# Patient Record
Sex: Female | Born: 1970 | Hispanic: No | Marital: Single | Smoking: Never smoker
Health system: Southern US, Community
[De-identification: ages and names within clinical notes are randomized; demographics above are authoritative.]

## PROBLEM LIST (undated history)

## (undated) DIAGNOSIS — E785 Hyperlipidemia, unspecified: Secondary | ICD-10-CM

## (undated) DIAGNOSIS — T7840XA Allergy, unspecified, initial encounter: Secondary | ICD-10-CM

## (undated) HISTORY — DX: Allergy, unspecified, initial encounter: T78.40XA

## (undated) HISTORY — PX: TUBAL LIGATION: SHX77

---
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
## 1997-08-28 DIAGNOSIS — R519 Headache, unspecified: Secondary | ICD-10-CM

## 1997-08-28 HISTORY — DX: Headache, unspecified: R51.9

## 1999-08-29 HISTORY — PX: TUBAL LIGATION: SHX77

## 2000-09-20 ENCOUNTER — Other Ambulatory Visit: Admission: RE | Admit: 2000-09-20 | Discharge: 2000-09-20 | Payer: Self-pay | Admitting: Obstetrics

## 2000-10-24 ENCOUNTER — Ambulatory Visit (HOSPITAL_COMMUNITY): Admission: RE | Admit: 2000-10-24 | Discharge: 2000-10-24 | Payer: Self-pay | Admitting: Cardiology

## 2000-10-24 ENCOUNTER — Encounter: Payer: Self-pay | Admitting: Cardiology

## 2000-12-04 ENCOUNTER — Encounter: Payer: Self-pay | Admitting: Emergency Medicine

## 2000-12-04 ENCOUNTER — Emergency Department (HOSPITAL_COMMUNITY): Admission: EM | Admit: 2000-12-04 | Discharge: 2000-12-04 | Payer: Self-pay | Admitting: Emergency Medicine

## 2001-01-29 ENCOUNTER — Emergency Department (HOSPITAL_COMMUNITY): Admission: EM | Admit: 2001-01-29 | Discharge: 2001-01-29 | Payer: Self-pay | Admitting: Emergency Medicine

## 2001-01-30 ENCOUNTER — Emergency Department (HOSPITAL_COMMUNITY): Admission: EM | Admit: 2001-01-30 | Discharge: 2001-01-30 | Payer: Self-pay | Admitting: *Deleted

## 2001-02-01 ENCOUNTER — Emergency Department (HOSPITAL_COMMUNITY): Admission: EM | Admit: 2001-02-01 | Discharge: 2001-02-01 | Payer: Self-pay | Admitting: Emergency Medicine

## 2001-04-30 ENCOUNTER — Encounter: Payer: Self-pay | Admitting: Cardiology

## 2001-04-30 ENCOUNTER — Encounter: Admission: RE | Admit: 2001-04-30 | Discharge: 2001-04-30 | Payer: Self-pay | Admitting: Cardiology

## 2001-05-05 ENCOUNTER — Emergency Department (HOSPITAL_COMMUNITY): Admission: EM | Admit: 2001-05-05 | Discharge: 2001-05-06 | Payer: Self-pay | Admitting: Emergency Medicine

## 2001-07-04 ENCOUNTER — Emergency Department (HOSPITAL_COMMUNITY): Admission: EM | Admit: 2001-07-04 | Discharge: 2001-07-04 | Payer: Self-pay | Admitting: Emergency Medicine

## 2001-08-22 ENCOUNTER — Encounter: Payer: Self-pay | Admitting: Emergency Medicine

## 2001-08-22 ENCOUNTER — Emergency Department (HOSPITAL_COMMUNITY): Admission: EM | Admit: 2001-08-22 | Discharge: 2001-08-22 | Payer: Self-pay | Admitting: Emergency Medicine

## 2001-10-11 ENCOUNTER — Emergency Department (HOSPITAL_COMMUNITY): Admission: EM | Admit: 2001-10-11 | Discharge: 2001-10-12 | Payer: Self-pay | Admitting: Emergency Medicine

## 2002-02-24 ENCOUNTER — Emergency Department (HOSPITAL_COMMUNITY): Admission: EM | Admit: 2002-02-24 | Discharge: 2002-02-25 | Payer: Self-pay | Admitting: Emergency Medicine

## 2002-02-24 ENCOUNTER — Encounter: Payer: Self-pay | Admitting: Emergency Medicine

## 2002-06-26 ENCOUNTER — Emergency Department (HOSPITAL_COMMUNITY): Admission: EM | Admit: 2002-06-26 | Discharge: 2002-06-26 | Payer: Self-pay | Admitting: Emergency Medicine

## 2002-06-27 ENCOUNTER — Encounter: Payer: Self-pay | Admitting: *Deleted

## 2003-05-27 ENCOUNTER — Emergency Department (HOSPITAL_COMMUNITY): Admission: EM | Admit: 2003-05-27 | Discharge: 2003-05-27 | Payer: Self-pay | Admitting: Emergency Medicine

## 2003-05-28 ENCOUNTER — Encounter: Payer: Self-pay | Admitting: Emergency Medicine

## 2003-06-03 ENCOUNTER — Emergency Department (HOSPITAL_COMMUNITY): Admission: AD | Admit: 2003-06-03 | Discharge: 2003-06-03 | Payer: Self-pay | Admitting: Family Medicine

## 2004-08-15 ENCOUNTER — Emergency Department (HOSPITAL_COMMUNITY): Admission: EM | Admit: 2004-08-15 | Discharge: 2004-08-15 | Payer: Self-pay | Admitting: Emergency Medicine

## 2006-02-05 ENCOUNTER — Emergency Department (HOSPITAL_COMMUNITY): Admission: EM | Admit: 2006-02-05 | Discharge: 2006-02-06 | Payer: Self-pay | Admitting: Emergency Medicine

## 2008-05-30 ENCOUNTER — Emergency Department (HOSPITAL_COMMUNITY): Admission: EM | Admit: 2008-05-30 | Discharge: 2008-05-30 | Payer: Self-pay | Admitting: Emergency Medicine

## 2009-11-29 LAB — LIPID PANEL: Triglycerides: 224 mg/dL — AB (ref 40–160)

## 2010-02-12 ENCOUNTER — Emergency Department (HOSPITAL_COMMUNITY)
Admission: EM | Admit: 2010-02-12 | Discharge: 2010-02-12 | Payer: Self-pay | Source: Home / Self Care | Admitting: Emergency Medicine

## 2010-02-19 ENCOUNTER — Emergency Department (HOSPITAL_COMMUNITY)
Admission: EM | Admit: 2010-02-19 | Discharge: 2010-02-19 | Payer: Self-pay | Source: Home / Self Care | Admitting: Emergency Medicine

## 2010-04-15 ENCOUNTER — Emergency Department (HOSPITAL_COMMUNITY): Admission: EM | Admit: 2010-04-15 | Discharge: 2010-04-15 | Payer: Self-pay | Admitting: Family Medicine

## 2010-04-18 ENCOUNTER — Ambulatory Visit: Payer: Self-pay | Admitting: Nurse Practitioner

## 2010-04-18 DIAGNOSIS — L503 Dermatographic urticaria: Secondary | ICD-10-CM

## 2010-04-18 LAB — CONVERTED CEMR LAB: Blood Glucose, Fingerstick: 83

## 2010-08-05 ENCOUNTER — Emergency Department (HOSPITAL_COMMUNITY)
Admission: EM | Admit: 2010-08-05 | Discharge: 2010-08-05 | Payer: Self-pay | Source: Home / Self Care | Admitting: Emergency Medicine

## 2010-08-09 ENCOUNTER — Ambulatory Visit: Payer: Self-pay | Admitting: Family Medicine

## 2010-08-09 DIAGNOSIS — M25519 Pain in unspecified shoulder: Secondary | ICD-10-CM

## 2010-08-09 DIAGNOSIS — M542 Cervicalgia: Secondary | ICD-10-CM | POA: Insufficient documentation

## 2010-08-17 ENCOUNTER — Encounter: Payer: Self-pay | Admitting: Family Medicine

## 2010-08-30 ENCOUNTER — Ambulatory Visit
Admission: RE | Admit: 2010-08-30 | Discharge: 2010-08-30 | Payer: Self-pay | Source: Home / Self Care | Attending: Family Medicine | Admitting: Family Medicine

## 2010-08-30 ENCOUNTER — Encounter: Payer: Self-pay | Admitting: Family Medicine

## 2010-09-18 ENCOUNTER — Encounter: Payer: Self-pay | Admitting: Obstetrics

## 2010-09-29 NOTE — Letter (Signed)
Summary: Out of Work  [facility] at [facility]  [address] [place], [place] [address]   Phone: [phone]  Fax: [phone]    August 09, 2010   Employee:  Sherri Walton    To Whom It May Concern:   For Medical reasons, please place on limited light duty capacity, maximum lifting 5 pounds until cleared by MD.   Start:   08/09/2010  End:   08/30/2009, f/u with MD then  If you need additional information, please feel free to contact our office.         Sincerely,    Hannah Beat MD

## 2010-09-29 NOTE — Assessment & Plan Note (Signed)
Summary: F/U NECK,MC   Vital Signs:  Patient profile:   40 year old female Pulse rate:   89 / minute BP sitting:   120 / 82  (left arm) CC: f/u neck pain improved, now w/ mid back pain    History of Present Illness: Follow up after change in meds and exercises to help neck stiffness.  Now pain in lower thoracic and upper lumbar area with associated stiffness/tightness in shoulders.  Neck stiffness has improved.  Numbness and tingling in fingers BL and R leg has continued, but predated neck stiffness. Multi-year.  Believes  Tizanidine are helping neck.  Has restricted lifting to <5 pounds.  Continues exercises with improvement.    Neck rom dramatically better  Preventive Screening-Counseling & Management  Alcohol-Tobacco     Smoking Status: never  Allergies: 1)  ! Toradol 2)  ! * Minocycline  Past History:  Past medical, surgical, family and social histories (including risk factors) reviewed, and no changes noted (except as noted below).  Past Medical History: Reviewed history from 08/09/2010 and no changes required. noncontributory  Past Surgical History: Reviewed history from 04/18/2010 and no changes required. c-section  tubal ligation  Family History: Reviewed history from 04/18/2010 and no changes required. father - gastric cancer (deceased) 11 mother - diabetes brother (deceased) heart disease at 41  Social History: Reviewed history from 04/18/2010 and no changes required. 3 children tobacco - none ETOH - none Drug - none  Review of Systems       REVIEW OF SYSTEMS  GEN: No systemic complaints, no fevers, chills, sweats, or other acute illnesses MSK: Detailed in the HPI GI: tolerating PO intake without difficulty Neuro: as above Otherwise the pertinent positives of the ROS are noted above.    Physical Exam  General:  Well-developed,well-nourished,in no acute distress; alert,appropriate and cooperative throughout examination Head:  Normocephalic and  atraumatic without obvious abnormalities. No apparent alopecia or balding. Ears:  no external deformities.   Nose:  no external deformity.   Lungs:  normal respiratory effort.   Msk:  Full ROM in neck.    Negative Tinel and Phalen's signs. No tenderness to palpation in spine or paraspinal muscles.  Slight scoliosis in lumbar spine.    Full ROM at shoulder str 5/5 c5-t1 intact motor and sensory  some scapular anterior rotation and prominence  Insignificant leg length discrepancy. Neurologic:  Normal strength, sensation in BL upper and lower extremities. Psych:  Cognition and judgment appear intact. Alert and cooperative with normal attention span and concentration. No apparent delusions, illusions, hallucinations   Impression & Recommendations:  Problem # 1:  SHOULDER PAIN (ICD-719.41) Assessment Improved  Continued bilateral shoulder stiffness with improvement in neck pain.  Will start Gabapentin.  suspect neuropathic origin  Her updated medication list for this problem includes:    Tizanidine Hcl 4 Mg Tabs (Tizanidine hcl) .Marland Kitchen... 1 by mouth at bedtime  Her updated medication list for this problem includes:    Tizanidine Hcl 4 Mg Tabs (Tizanidine hcl) .Marland Kitchen... 1 by mouth at bedtime  Problem # 2:  NECK PAIN (ICD-723.1)  Her updated medication list for this problem includes:    Tizanidine Hcl 4 Mg Tabs (Tizanidine hcl) .Marland Kitchen... 1 by mouth at bedtime  Complete Medication List: 1)  Loratadine 10 Mg Tabs (Loratadine) .... One tablet by mouth daily 2)  Tizanidine Hcl 4 Mg Tabs (Tizanidine hcl) .Marland Kitchen.. 1 by mouth at bedtime 3)  Gabapentin 300 Mg Caps (Gabapentin) .Marland Kitchen.. 1 by mouth three times  a day  Patient Instructions: 1)  Keep up the neck and shoulder exercises. 2)  Gabapentin titration: 3)  1 tablet at night for 3 days, then 1 tablet twice a day for 3 days, then start 1 tablet three times a day  4)  recheck 6 weeks Prescriptions: GABAPENTIN 300 MG CAPS (GABAPENTIN) 1 by mouth three  times a day  #90 x 2   Entered and Authorized by:   Hannah Beat MD   Signed by:   Hannah Beat MD on 08/30/2010   Method used:   Print then Give to Patient   RxID:   [id number]    Orders Added: 1)  Est. Patient Level III [87564]

## 2010-09-29 NOTE — Assessment & Plan Note (Signed)
Summary: NEW - Establish Care   Vital Signs:  Patient profile:   40 year old female LMP:     02/2010 Height:      67 inches Weight:      201.2 pounds BMI:     31.63 Temp:     97.7 degrees F oral Pulse rate:   73 / minute Pulse rhythm:   regular Resp:     20 per minute BP sitting:   109 / 70  (left arm) Cuff size:   regular  Vitals Entered By: Levon Hedger (April 18, 2010 12:17 PM)  Nutrition Counseling: Patient's BMI is greater than 25 and therefore counseled on weight management options. CC: follow-up visit urgent care skin irritation with itching and discomfort, alot of burning x 2 months Is Patient Diabetic? No Pain Assessment Patient in pain? no      CBG Result 83 CBG Device ID [id number]  Does patient need assistance? Functional Status Self care Ambulation Normal LMP (date): 02/2010     Enter LMP: 02/2010   CC:  follow-up visit urgent care skin irritation with itching and discomfort and alot of burning x 2 months.  History of Present Illness:  Pt into the office to establish care.   Previously going to Dr. Gaynell Face (who is her PCP) however her medicaid card indicated [facility] .  That office also does not accept medicaid.  Pt only went for GYN concerns and was seen usually once per year.  Skin - started about 2 months ago. Whelps come spontaneously and then resolve in about 5-10 minutes. She does not scratch.  initially started on chest now whelps can occur on any part of the body.  Seems to be increasing in intensity. She also says that she knows the areas are present because she has tingling. Recent out of the country travel to [place].  She stayed 8 days. Pt has gone to Er at least twice for this problem.  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  Habits & Providers  Alcohol-Tobacco-Diet     Alcohol drinks/day: 0     Tobacco Status:  never  Exercise-Depression-Behavior     Drug Use: never  Allergies (verified): 1)  ! Toradol 2)  ! * Minocycline  Past History:  Past Surgical History: c-section  tubal ligation  Family History: father - gastric cancer (deceased) 81 mother - diabetes brother (deceased) heart disease at 20  Social History: 3 children tobacco - none ETOH - none Drug - noneSmoking Status:  never Drug Use:  never  Review of Systems CV:  Denies chest pain or discomfort. Resp:  Denies cough. GI:  Denies abdominal pain, nausea, and vomiting. Derm:  +whelps.  Physical Exam  General:  alert.   Head:  normocephalic.   Eyes:  glasses Msk:  normal ROM.   Neurologic:  alert & oriented X3.   Skin:  spontaneous linear whelp of urticaria appeared in exact locations of strokes Psych:  Oriented X3.     Impression & Recommendations:  Problem # 1:  DERMATOGRAPHIA (ICD-708.3) reviewed with pt the dx will start on anti-histamine pt questions referral to allergist but does not think this necessary  Complete Medication List: 1)  Loratadine 10 Mg Tabs (Loratadine) .... One tablet by mouth daily  Patient Instructions: 1)  Read the handout 2)  Take anti-histamine by mouth daily - this will not cure  but will ease the symptoms some. 3)  Follow up as needed Prescriptions: LORATADINE 10 MG TABS (LORATADINE) One tablet by mouth daily  #30 x 3   Entered and Authorized by:   Lehman Prom FNP   Signed by:   Lehman Prom FNP on 04/18/2010   Method used:   Print then Give to Patient   RxID:   [id number]

## 2010-09-29 NOTE — Assessment & Plan Note (Signed)
Summary: NP,NECK/SHOULDER PAIN,MC   Vital Signs:  Patient profile:   40 year old female Height:      67 inches Weight:      210 pounds BMI:     33.01 Pulse rate:   74 / minute BP sitting:   115 / 77  Vitals Entered By: Rochele Pages RN (August 09, 2010 1:41 PM) CC: rt side neck and shoulder pain, rt arm and leg numbness, finger tingling x1 week   History of Present Illness: 40 year old female:  very pleasant patient who presents with a 1-2 week history right-sided neck, shoulder blade, and arm pain. She also complains of some shoulder pain.  Currently has pain of motion of the neck and she has had some intermittent tingling of the right arm. She currently has none. Does not provoke will.  She saw her primary care provider and was placed on Naprelan. She also subsequently went to the ER and was given some thiamine hydrocodone. All of her symptoms persist, though the numbness is improved.  She has been lifting more heavy boxes at work, greater than 50 pounds, and she states this is exacerbated the posterior shoulder blade and neck musculature.  REVIEW OF SYSTEMS  GEN: No systemic complaints, no fevers, chills, sweats, or other acute illnesses MSK: Detailed in the HPI GI: tolerating PO intake without difficulty Neuro: as above Otherwise the pertinent positives of the ROS are noted above.    Preventive Screening-Counseling & Management  Alcohol-Tobacco     Smoking Status: never  Allergies: 1)  ! Toradol 2)  ! * Minocycline  Past History:  Past medical, surgical, family and social histories (including risk factors) reviewed, and no changes noted (except as noted below).  Past Medical History: noncontributory  Past Surgical History: Reviewed history from 04/18/2010 and no changes required. c-section  tubal ligation  Family History: Reviewed history from 04/18/2010 and no changes required. father - gastric cancer (deceased) 7 mother - diabetes brother (deceased)  heart disease at 40  Social History: Reviewed history from 04/18/2010 and no changes required. 3 children tobacco - none ETOH - none Drug - none  Physical Exam  General:  GEN: Well-developed,well-nourished,in no acute distress; alert,appropriate and cooperative throughout examination HEENT: Normocephalic and atraumatic without obvious abnormalities. No apparent alopecia or balding. Ears, externally no deformities PULM: Breathing comfortably in no respiratory distress EXT: No clubbing, cyanosis, or edema PSYCH: Normally interactive. Cooperative during the interview. Pleasant. Friendly and conversant. Not anxious or depressed appearing. Normal, full affect.  Msk:  cervical spine: Significant loss of motion, approximately 60-70% of terminal flexion, and extension, lateral bending, and rotation in both directions. Follow tender along the spinous processes.  Negative Spurling's examination. C5-T1 is intact.  Grip strength is mildly decreased on the right compared to left.  Sensation intact.  Shoulder: R Inspection: No muscle wasting or winging Ecchymosis/edema: neg  AC joint, scapula, clavicle: NT Cervical spine: NT, full ROM Spurling's: neg Abduction: full, 5/5 Flexion: full, 5/5 IR, full, lift-off: 5/5 ER at neutral: full, 5/5 AC crossover and compression: neg Neer: neg Hawkins: neg Drop Test: neg Empty Can: neg Supraspinatus insertion: NT Bicipital groove: NT Speed's: neg Yergason's: neg Sulcus sign: neg Scapular dyskinesis: none C5-T1 intact Sensation intact Grip 5/5    Impression & Recommendations:  Problem # 1:  NECK PAIN (ICD-723.1) cystoscopy with acute muscle strain. No evidence today of any significant neurological findings red flags. We'll treat conservative.  Continue with naproxen, and change to Zanaflex at nighttime due  to sedation for bowing.  Place of additional limited light duty capacity, and follow up in 3 weeks.  Handouts reviewed with the  patient.  Range of motion in massage, and heat as well as home exercise program.  Her updated medication list for this problem includes:    Tizanidine Hcl 4 Mg Tabs (Tizanidine hcl) .Marland Kitchen... 1 by mouth at bedtime  Problem # 2:  SHOULDER PAIN (ICD-719.41)  Her updated medication list for this problem includes:    Tizanidine Hcl 4 Mg Tabs (Tizanidine hcl) .Marland Kitchen... 1 by mouth at bedtime  Complete Medication List: 1)  Loratadine 10 Mg Tabs (Loratadine) .... One tablet by mouth daily 2)  Tizanidine Hcl 4 Mg Tabs (Tizanidine hcl) .Marland Kitchen.. 1 by mouth at bedtime Prescriptions: TIZANIDINE HCL 4 MG TABS (TIZANIDINE HCL) 1 by mouth at bedtime  #30 x 1   Entered and Authorized by:   Hannah Beat MD   Signed by:   Hannah Beat MD on 08/09/2010   Method used:   Print then Give to Patient   RxID:   [id number]    Orders Added: 1)  New Patient Level III [40102]

## 2010-09-29 NOTE — Letter (Signed)
Summary: Work Excuse  [facility]  [address] [place], [place] [address]   Phone: [phone]  Fax: [phone]    Today's Date: April 18, 2010  Name of Patient: Sherri Walton  The above named patient had a medical visit today   Please take this into consideration when reviewing the time away from work    Special Instructions:  [  ] None  [ X ] To be off the remainder of today, returning to the normal work  schedule tomorrow.  [  ] To be off until the next scheduled appointment on ______________________.  [  ] Other ________________________________________________________________ ________________________________________________________________________   Sincerely yours,   Lehman Prom FNP [facility]

## 2010-09-29 NOTE — Letter (Signed)
Summary: Out of Work  [facility]  [address]   [place], [place] [address]   Phone: [phone]  Fax: [phone]    August 30, 2010   Employee:  PRESLEI BLAKLEY    To Whom It May Concern:   For Medical reasons, ok to return to work, maximum lifting 20 pounds:  Start:   08/30/2010  End:   recheck 6 weeks  If you need additional information, please feel free to contact our office.         Sincerely,    Hannah Beat MD

## 2010-09-29 NOTE — Letter (Signed)
Summary: PT INFORMATION SHEET  PT INFORMATION SHEET   Imported By: Arta Bruce 04/19/2010 10:39:20  _____________________________________________________________________  External Attachment:    Type:   Image     Comment:   External Document

## 2010-09-29 NOTE — Letter (Signed)
Summary: ROI  ROI   Imported By: Marily Memos 08/17/2010 09:50:56  _____________________________________________________________________  External Attachment:    Type:   Image     Comment:   External Document

## 2010-11-10 LAB — WET PREP, GENITAL: Clue Cells Wet Prep HPF POC: NONE SEEN

## 2010-11-10 LAB — GC/CHLAMYDIA PROBE AMP, GENITAL
Chlamydia, DNA Probe: NEGATIVE
GC Probe Amp, Genital: NEGATIVE

## 2010-11-10 LAB — POCT URINALYSIS DIPSTICK
Glucose, UA: NEGATIVE mg/dL
Nitrite: POSITIVE — AB
Protein, ur: 30 mg/dL — AB
Urobilinogen, UA: 0.2 mg/dL (ref 0.0–1.0)
pH: 7 (ref 5.0–8.0)

## 2010-11-10 LAB — URINE CULTURE

## 2010-12-17 ENCOUNTER — Emergency Department (HOSPITAL_COMMUNITY)
Admission: EM | Admit: 2010-12-17 | Discharge: 2010-12-17 | Disposition: A | Discharge status: Home / Self Care | Payer: Medicaid Other | Attending: Emergency Medicine | Admitting: Emergency Medicine

## 2010-12-17 DIAGNOSIS — R10815 Periumbilic abdominal tenderness: Secondary | ICD-10-CM | POA: Insufficient documentation

## 2010-12-17 DIAGNOSIS — Z9889 Other specified postprocedural states: Secondary | ICD-10-CM | POA: Insufficient documentation

## 2010-12-17 DIAGNOSIS — R197 Diarrhea, unspecified: Secondary | ICD-10-CM | POA: Insufficient documentation

## 2010-12-17 LAB — POCT I-STAT, CHEM 8
BUN: 11 mg/dL (ref 6–23)
Calcium, Ion: 1.15 mmol/L (ref 1.12–1.32)
Chloride: 107 meq/L (ref 96–112)
Creatinine, Ser: 0.9 mg/dL (ref 0.4–1.2)
Glucose, Bld: 103 mg/dL — ABNORMAL HIGH (ref 70–99)
HCT: 40 % (ref 36.0–46.0)
Hemoglobin: 13.6 g/dL (ref 12.0–15.0)
Potassium: 4.2 meq/L (ref 3.5–5.1)
Sodium: 139 meq/L (ref 135–145)
TCO2: 22 mmol/L (ref 0–100)

## 2010-12-17 LAB — OCCULT BLOOD, POC DEVICE: Fecal Occult Bld: NEGATIVE

## 2011-05-29 LAB — GLUCOSE, CAPILLARY: Glucose-Capillary: 89

## 2011-11-30 ENCOUNTER — Emergency Department (HOSPITAL_COMMUNITY)
Admission: EM | Admit: 2011-11-30 | Discharge: 2011-11-30 | Disposition: A | Discharge status: Home / Self Care | Payer: Self-pay | Attending: Emergency Medicine | Admitting: Emergency Medicine

## 2011-11-30 ENCOUNTER — Encounter (HOSPITAL_COMMUNITY): Payer: Self-pay | Admitting: *Deleted

## 2011-11-30 DIAGNOSIS — N390 Urinary tract infection, site not specified: Secondary | ICD-10-CM | POA: Insufficient documentation

## 2011-11-30 LAB — URINALYSIS, ROUTINE W REFLEX MICROSCOPIC
Ketones, ur: NEGATIVE mg/dL
Nitrite: POSITIVE — AB
Specific Gravity, Urine: 1.021 (ref 1.005–1.030)
Urobilinogen, UA: 0.2 mg/dL (ref 0.0–1.0)
pH: 6 (ref 5.0–8.0)

## 2011-11-30 LAB — URINE MICROSCOPIC-ADD ON

## 2011-11-30 MED ORDER — CIPROFLOXACIN HCL 500 MG PO TABS: 500.0000 mg | ORAL_TABLET | Freq: Two times a day (BID) | ORAL | Status: AC

## 2011-11-30 NOTE — Discharge Instructions (Signed)

## 2011-11-30 NOTE — ED Notes (Signed)
Pt reports painful urination x 2 weeks. Denies frequency.

## 2011-11-30 NOTE — ED Provider Notes (Signed)
History     CSN: [id number]  Arrival date & time 11/30/11  0920   First MD Initiated Contact with Patient 11/30/11 1035      Chief Complaint  Patient presents with  . Urinary Retention    (Consider location/radiation/quality/duration/timing/severity/associated sxs/prior treatment) HPI  Patient with dysuria, frequency and odor to urine for two weeks with symptoms worsening.  No fever, chills, nausea or vomiting.  Periods regular, no birth control, denies sexual activity, some vaginal discharge prior to menses but not now.    History reviewed. No pertinent past medical history.  Past Surgical History  Procedure Date  . Cesarean section   . Tubal ligation     No family history on file.  History  Substance Use Topics  . Smoking status: Never Smoker   . Smokeless tobacco: Not on file  . Alcohol Use: No    OB History    Grav Para Term Preterm Abortions TAB SAB Ect Mult Living                  Review of Systems  All other systems reviewed and are negative.    Allergies  Ketorolac tromethamine and Minocycline  Home Medications   Current Outpatient Rx  Name Route Sig Dispense Refill  . ACETAMINOPHEN 500 MG PO TABS Oral Take 1,000 mg by mouth every 6 (six) hours as needed. pain    . ADULT MULTIVITAMIN W/MINERALS CH Oral Take 1 tablet by mouth daily.      BP 118/73  Pulse 85  Temp(Src) 98 F (36.7 C) (Oral)  Resp 16  SpO2 100%  Physical Exam  Nursing note and vitals reviewed. Constitutional: She appears well-developed and well-nourished.  HENT:  Head: Normocephalic and atraumatic.  Eyes: Conjunctivae and EOM are normal. Pupils are equal, round, and reactive to light.  Neck: Normal range of motion. Neck supple.  Cardiovascular: Normal rate, regular rhythm, normal heart sounds and intact distal pulses.   Pulmonary/Chest: Effort normal and breath sounds normal.  Abdominal: Soft. Bowel sounds are normal.  Musculoskeletal: Normal range of motion.    Neurological: She is alert.  Skin: Skin is warm and dry.  Psychiatric: She has a normal mood and affect. Thought content normal.    ED Course  Procedures (including critical care time)  Labs Reviewed  URINALYSIS, ROUTINE W REFLEX MICROSCOPIC - Abnormal; Notable for the following:    APPearance CLOUDY (*)    Hgb urine dipstick MODERATE (*)    Nitrite POSITIVE (*)    Leukocytes, UA SMALL (*)    All other components within normal limits  URINE MICROSCOPIC-ADD ON - Abnormal; Notable for the following:    Bacteria, UA MANY (*)    All other components within normal limits  POCT PREGNANCY, URINE   No results found.   No diagnosis found.    MDM  Patient with nitrite and le positive ua with uti symptoms.  Plan bactrim ds for uti.       Hilario Quarry, MD 11/30/11 1122

## 2012-08-26 ENCOUNTER — Ambulatory Visit (INDEPENDENT_AMBULATORY_CARE_PROVIDER_SITE_OTHER): Payer: PRIVATE HEALTH INSURANCE | Admitting: Family Medicine

## 2012-08-26 ENCOUNTER — Encounter: Payer: Self-pay | Admitting: Physician Assistant

## 2012-08-26 VITALS — BP 105/73 | HR 78 | Temp 98.3°F | Resp 16 | Ht 68.0 in | Wt 218.2 lb

## 2012-08-26 DIAGNOSIS — E7800 Pure hypercholesterolemia, unspecified: Secondary | ICD-10-CM

## 2012-08-26 DIAGNOSIS — R350 Frequency of micturition: Secondary | ICD-10-CM

## 2012-08-26 DIAGNOSIS — N760 Acute vaginitis: Secondary | ICD-10-CM

## 2012-08-26 DIAGNOSIS — J069 Acute upper respiratory infection, unspecified: Secondary | ICD-10-CM

## 2012-08-26 DIAGNOSIS — Z0000 Encounter for general adult medical examination without abnormal findings: Secondary | ICD-10-CM

## 2012-08-26 DIAGNOSIS — Z789 Other specified health status: Secondary | ICD-10-CM

## 2012-08-26 DIAGNOSIS — Z23 Encounter for immunization: Secondary | ICD-10-CM

## 2012-08-26 DIAGNOSIS — N390 Urinary tract infection, site not specified: Secondary | ICD-10-CM

## 2012-08-26 DIAGNOSIS — B373 Candidiasis of vulva and vagina: Secondary | ICD-10-CM

## 2012-08-26 DIAGNOSIS — N898 Other specified noninflammatory disorders of vagina: Secondary | ICD-10-CM

## 2012-08-26 LAB — POCT CBC
Granulocyte percent: 48.5 %G (ref 37–80)
MID (cbc): 0.4 (ref 0–0.9)
MPV: 7.4 fL (ref 0–99.8)
POC Granulocyte: 1.9 — AB (ref 2–6.9)
POC LYMPH PERCENT: 40 %L (ref 10–50)
POC MID %: 11.5 %M (ref 0–12)
Platelet Count, POC: 305 10*3/uL (ref 142–424)
RBC: 4.46 M/uL (ref 4.04–5.48)
RDW, POC: 12.6 %

## 2012-08-26 LAB — POCT UA - MICROSCOPIC ONLY
Crystals, Ur, HPF, POC: NEGATIVE
Mucus, UA: NEGATIVE

## 2012-08-26 LAB — COMPREHENSIVE METABOLIC PANEL
AST: 19 U/L (ref 0–37)
Albumin: 4.2 g/dL (ref 3.5–5.2)
Alkaline Phosphatase: 72 U/L (ref 39–117)
Glucose, Bld: 86 mg/dL (ref 70–99)
Potassium: 3.9 mEq/L (ref 3.5–5.3)
Sodium: 142 mEq/L (ref 135–145)
Total Bilirubin: 0.8 mg/dL (ref 0.3–1.2)
Total Protein: 6.9 g/dL (ref 6.0–8.3)

## 2012-08-26 LAB — LIPID PANEL
Cholesterol: 238 mg/dL — ABNORMAL HIGH (ref 0–200)
Triglycerides: 166 mg/dL — ABNORMAL HIGH (ref <150)
VLDL: 33 mg/dL (ref 0–40)

## 2012-08-26 LAB — POCT URINALYSIS DIPSTICK
Bilirubin, UA: NEGATIVE
Ketones, UA: NEGATIVE
Protein, UA: NEGATIVE
Spec Grav, UA: 1.015
pH, UA: 7

## 2012-08-26 LAB — POCT WET PREP WITH KOH
KOH Prep POC: POSITIVE
Trichomonas, UA: NEGATIVE

## 2012-08-26 LAB — GLUCOSE, POCT (MANUAL RESULT ENTRY): POC Glucose: 78 mg/dl (ref 70–99)

## 2012-08-26 MED ORDER — GUAIFENESIN ER 1200 MG PO TB12: 1.0000 | ORAL_TABLET | Freq: Two times a day (BID) | ORAL | Status: DC

## 2012-08-26 MED ORDER — FLUCONAZOLE 150 MG PO TABS: 150.0000 mg | ORAL_TABLET | Freq: Once | ORAL | Status: DC

## 2012-08-26 MED ORDER — ALBUTEROL SULFATE HFA 108 (90 BASE) MCG/ACT IN AERS: 2.0000 | INHALATION_SPRAY | Freq: Four times a day (QID) | RESPIRATORY_TRACT | Status: DC | PRN

## 2012-08-26 MED ORDER — HYDROCOD POLST-CHLORPHEN POLST 10-8 MG/5ML PO LQCR: 5.0000 mL | Freq: Two times a day (BID) | ORAL | Status: DC

## 2012-08-26 MED ORDER — METRONIDAZOLE 500 MG PO TABS: 500.0000 mg | ORAL_TABLET | Freq: Two times a day (BID) | ORAL | Status: DC

## 2012-08-26 MED ORDER — CIPROFLOXACIN HCL 500 MG PO TABS: 500.0000 mg | ORAL_TABLET | Freq: Two times a day (BID) | ORAL | Status: DC

## 2012-08-26 NOTE — Progress Notes (Signed)
[address] [place] [address]   Phone [phone]  Subjective:    Patient ID: Sherri Walton, female    DOB: [birth year], 41 y.o.   MRN: [id number]  HPI Pt presents to clinic for several concerns 1- wants a CPE - had a pap smear within the last couple of years, unsure if it was normal.  No new sexual partners but would like STD screening.  Has had some tingling in her hands and fingers of both hands while at work 2-3 times in the last month, she has had no weakness and it only lasts a few minutes.  She has had similar sensation in her R foot and leg.  She has had no injuries.  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  When she stands she stands for long periods of time with his R hip off the the side.   She never has these pains at home or when not working.  She has had h/o elevated cholesterol but has never been treated with medications.  She has never had a mammogram and does not do self breast exams. 2- cold symptoms over the last couple of days.  Daughter was sick with similar symptoms.  She has nasal congestion and a cough which is worse at night. Last pm she was wheezing, though has had no h/o asthma her daughter does have asthma.  She has used no OTC medications.   3- she has had urinary frequency and urgency with some dysuria for about the past week.  Her urine also has a strong odor.  She has some vaginal discharge but does not believe that is what smells.  She has no itching.   Review of Systems  Constitutional: Negative for fever, chills, activity change and appetite change.  HENT: Positive for congestion, rhinorrhea and postnasal drip. Negative for ear pain, sore throat, sneezing, neck pain and sinus pressure.   Respiratory: Positive for cough (non-productive) and wheezing (at night). Negative for shortness of breath.   Cardiovascular: Negative for chest pain, palpitations and leg swelling.  Gastrointestinal: Negative.   Genitourinary: Positive for dysuria, frequency and  vaginal discharge. Negative for hematuria, flank pain, decreased urine volume, vaginal bleeding, genital sores, vaginal pain, menstrual problem (regular menses) and dyspareunia.  Musculoskeletal: Negative.   Skin: Negative.   Neurological: Positive for headaches (no change from her normal). Negative for dizziness, weakness and numbness.  Hematological: Negative.   Psychiatric/Behavioral: Negative.        Objective:   Physical Exam  Vitals reviewed. Constitutional: She is oriented to person, place, and time. She appears well-developed and well-nourished.  HENT:  Head: Normocephalic and atraumatic.  Right Ear: Hearing, tympanic membrane, external ear and ear canal normal.  Left Ear: Hearing, tympanic membrane, external ear and ear canal normal.  Nose: Mucosal edema present.  Mouth/Throat: Uvula is midline, oropharynx is clear and moist and mucous membranes are normal. No oropharyngeal exudate.  Eyes: Conjunctivae normal and EOM are normal. Pupils are equal, round, and reactive to light.  Neck: Neck supple. No thyromegaly present.  Cardiovascular: Normal rate, regular rhythm and normal heart sounds.   No murmur heard. Pulmonary/Chest: Effort normal and breath sounds normal. She has no wheezes.  Abdominal: Soft. Bowel sounds are normal. There is no CVA tenderness.  Genitourinary: Uterus normal. No breast swelling, tenderness, discharge or bleeding. Pelvic exam was performed with patient supine. There is no rash, tenderness, lesion or injury on the right labia. There is no rash, tenderness, lesion or  injury on the left labia. Cervix exhibits no motion tenderness, no discharge and no friability. Right adnexum displays no mass, no tenderness and no fullness. Left adnexum displays no mass, no tenderness and no fullness. No erythema, tenderness or bleeding around the vagina. No foreign body around the vagina. No signs of injury around the vagina. Vaginal discharge (thick white d/c in vaginal canal)  found.  Musculoskeletal:       neg Tinels and neg phalens.  No weakness or atrophy of hands, finger, feet toes.  Lymphadenopathy:    She has no cervical adenopathy.  Neurological: She is alert and oriented to person, place, and time. She has normal strength. No cranial nerve deficit or sensory deficit.  Reflex Scores:      Bicep reflexes are 2+ on the right side and 2+ on the left side.      Brachioradialis reflexes are 2+ on the right side and 2+ on the left side.      Patellar reflexes are 2+ on the right side and 2+ on the left side.      Achilles reflexes are 2+ on the right side and 2+ on the left side. Skin: Skin is warm and dry. She is not diaphoretic.  Psychiatric: She has a normal mood and affect. Her behavior is normal. Judgment and thought content normal.    Results for orders placed in visit on 08/26/12  POCT UA - MICROSCOPIC ONLY      Component Value Range   WBC, Ur, HPF, POC 10-15     RBC, urine, microscopic 1-4     Bacteria, U Microscopic 4++     Mucus, UA neg     Epithelial cells, urine per micros 5-10     Crystals, Ur, HPF, POC neg     Casts, Ur, LPF, POC neg     Yeast, UA neg    POCT URINALYSIS DIPSTICK      Component Value Range   Color, UA yellow     Clarity, UA cloudy     Glucose, UA neg     Bilirubin, UA neg     Ketones, UA neg     Spec Grav, UA 1.015     Blood, UA moderate     pH, UA 7.0     Protein, UA neg     Urobilinogen, UA 1.0     Nitrite, UA postivie     Leukocytes, UA moderate (2+)    POCT CBC      Component Value Range   WBC 3.9 (*) 4.6 - 10.2 K/uL   Lymph, poc 1.6  0.6 - 3.4   POC LYMPH PERCENT 40.0  10 - 50 %L   MID (cbc) 0.4  0 - 0.9   POC MID % 11.5  0 - 12 %M   POC Granulocyte 1.9 (*) 2 - 6.9   Granulocyte percent 48.5  37 - 80 %G   RBC 4.46  4.04 - 5.48 M/uL   Hemoglobin 13.6  12.2 - 16.2 g/dL   HCT, POC 16.1  09.6 - 47.9 %   MCV 98.8 (*) 80 - 97 fL   MCH, POC 30.5  27 - 31.2 pg   MCHC 30.8 (*) 31.8 - 35.4 g/dL   RDW, POC  04.5     Platelet Count, POC 305  142 - 424 K/uL   MPV 7.4  0 - 99.8 fL  GLUCOSE, POCT (MANUAL RESULT ENTRY)      Component Value Range   POC Glucose  78  70 - 99 mg/dl  POCT WET PREP WITH KOH      Component Value Range   Trichomonas, UA Negative     Clue Cells Wet Prep HPF POC 100%     Epithelial Wet Prep HPF POC tntc     Yeast Wet Prep HPF POC positive     Bacteria Wet Prep HPF POC 4++     RBC Wet Prep HPF POC tntc     WBC Wet Prep HPF POC tntc     KOH Prep POC Positive      EKG results: Normal.  Read by Dr. Patsy Lager.      Assessment & Plan:   1. Annual physical exam  EKG 12-Lead, POCT CBC, POCT glucose (manual entry), Lipid panel, RPR, TSH, HIV antibody, Hepatitis C antibody, Hepatitis B surface antigen, Comprehensive metabolic panel, Pap IG, CT/NG w/ reflex HPV when ASC-U, MM Digital Screening, metroNIDAZOLE (FLAGYL) 500 MG tablet  2. Urinary frequency  POCT UA - Microscopic Only, POCT urinalysis dipstick  3. Flu vaccine need  Flu vaccine greater than or equal to 3yo preservative free IM  4. Vaginal discharge  POCT Wet Prep with KOH  5. Urinary tract infection, site not specified  Urine culture, ciprofloxacin (CIPRO) 500 MG tablet  6. BV (bacterial vaginosis)  metroNIDAZOLE (FLAGYL) 500 MG tablet  7. Yeast vaginitis  fluconazole (DIFLUCAN) 150 MG tablet  8. Viral URI with cough  Guaifenesin (MUCINEX MAXIMUM STRENGTH) 1200 MG TB12, chlorpheniramine-HYDROcodone (TUSSIONEX PENNKINETIC ER) 10-8 MG/5ML LQCR, albuterol (PROVENTIL HFA;VENTOLIN HFA) 108 (90 BASE) MCG/ACT inhaler

## 2012-08-27 LAB — HEPATITIS C ANTIBODY: HCV Ab: NEGATIVE

## 2012-08-27 LAB — TSH: TSH: 0.915 u[IU]/mL (ref 0.350–4.500)

## 2012-08-27 LAB — HIV ANTIBODY (ROUTINE TESTING W REFLEX): HIV: NONREACTIVE

## 2012-08-28 LAB — PAP IG, CT-NG, RFX HPV ASCU
Chlamydia Probe Amp: NEGATIVE
GC Probe Amp: NEGATIVE

## 2012-08-29 LAB — URINE CULTURE: Colony Count: >=100000

## 2012-09-02 MED ORDER — PRAVASTATIN SODIUM 40 MG PO TABS: 40.0000 mg | ORAL_TABLET | Freq: Every day | ORAL | Status: DC

## 2012-09-02 NOTE — Addendum Note (Signed)
Addended by: Morrell Riddle on: 09/02/2012 11:20 AM   Modules accepted: Orders

## 2012-09-04 ENCOUNTER — Encounter: Payer: Self-pay | Admitting: *Deleted

## 2012-10-04 ENCOUNTER — Other Ambulatory Visit: Payer: Self-pay | Admitting: Physician Assistant

## 2012-10-04 DIAGNOSIS — E7800 Pure hypercholesterolemia, unspecified: Secondary | ICD-10-CM

## 2012-10-04 MED ORDER — PRAVASTATIN SODIUM 40 MG PO TABS: 40.0000 mg | ORAL_TABLET | Freq: Every day | ORAL | Status: DC

## 2012-10-04 NOTE — Telephone Encounter (Signed)
Please call and tell patient that I sent in a Rx for her cholesterol and I should see her 2 months after starting the medication.

## 2012-10-09 ENCOUNTER — Ambulatory Visit (HOSPITAL_COMMUNITY)
Admission: RE | Admit: 2012-10-09 | Discharge: 2012-10-09 | Disposition: A | Discharge status: Home / Self Care | Payer: Medicaid Other | Source: Ambulatory Visit | Attending: Physician Assistant | Admitting: Physician Assistant

## 2012-10-09 DIAGNOSIS — Z1231 Encounter for screening mammogram for malignant neoplasm of breast: Secondary | ICD-10-CM | POA: Insufficient documentation

## 2012-10-09 DIAGNOSIS — Z0000 Encounter for general adult medical examination without abnormal findings: Secondary | ICD-10-CM

## 2013-01-31 ENCOUNTER — Emergency Department (HOSPITAL_COMMUNITY): Payer: Medicaid Other

## 2013-01-31 ENCOUNTER — Emergency Department (HOSPITAL_COMMUNITY)
Admission: EM | Admit: 2013-01-31 | Discharge: 2013-01-31 | Disposition: A | Discharge status: Home / Self Care | Payer: Medicaid Other | Attending: Emergency Medicine | Admitting: Emergency Medicine

## 2013-01-31 ENCOUNTER — Encounter (HOSPITAL_COMMUNITY): Payer: Self-pay | Admitting: Emergency Medicine

## 2013-01-31 DIAGNOSIS — J029 Acute pharyngitis, unspecified: Secondary | ICD-10-CM | POA: Insufficient documentation

## 2013-01-31 DIAGNOSIS — J9801 Acute bronchospasm: Secondary | ICD-10-CM | POA: Insufficient documentation

## 2013-01-31 MED ORDER — ALBUTEROL SULFATE HFA 108 (90 BASE) MCG/ACT IN AERS: 1.0000 | INHALATION_SPRAY | Freq: Four times a day (QID) | RESPIRATORY_TRACT | Status: DC | PRN

## 2013-01-31 MED ORDER — PREDNISONE 10 MG PO TABS: 20.0000 mg | ORAL_TABLET | Freq: Every day | ORAL | Status: DC

## 2013-01-31 NOTE — ED Provider Notes (Signed)
History     CSN: [id number]  Arrival date & time 01/31/13  1239   First MD Initiated Contact with Patient 01/31/13 1256      Chief Complaint  Patient presents with  . Cough    (Consider location/radiation/quality/duration/timing/severity/associated sxs/prior treatment) Patient is a 42 y.o. female presenting with cough. The history is provided by the patient.  Cough  patient here complaining of cough x4 days which is been nonproductive. Denies any fever or chills. Symptoms worse at night. Has used over-the-counter medications without relief. Denies any anginal type chest pain orthopnea or peripheral edema. Has URI symptoms consisting of sore throat. Nothing makes her symptoms better. She did use some once bronchodilator with questionable help  History reviewed. No pertinent past medical history.  Past Surgical History  Procedure Laterality Date  . Cesarean section    . Tubal ligation      Family History  Problem Relation Age of Onset  . Hypertension Mother   . Diabetes Mother   . Cancer Father     Stomach  . Heart disease Brother   . Alcohol abuse Paternal Aunt     History  Substance Use Topics  . Smoking status: Never Smoker   . Smokeless tobacco: Not on file  . Alcohol Use: No    OB History   Grav Para Term Preterm Abortions TAB SAB Ect Mult Living                  Review of Systems  Respiratory: Positive for cough.   All other systems reviewed and are negative.    Allergies  Ketorolac tromethamine and Minocycline  Home Medications   Current Outpatient Rx  Name  Route  Sig  Dispense  Refill  . acetaminophen (TYLENOL) 500 MG tablet   Oral   Take 1,000 mg by mouth every 6 (six) hours as needed. pain         . albuterol (PROVENTIL HFA;VENTOLIN HFA) 108 (90 BASE) MCG/ACT inhaler   Inhalation   Inhale 2 puffs into the lungs every 6 (six) hours as needed for wheezing.   1 Inhaler   0   . chlorpheniramine-HYDROcodone (TUSSIONEX PENNKINETIC ER) 10-8  MG/5ML LQCR   Oral   Take 5 mLs by mouth every 12 (twelve) hours.   70 mL   0   . ciprofloxacin (CIPRO) 500 MG tablet   Oral   Take 1 tablet (500 mg total) by mouth 2 (two) times daily.   6 tablet   0   . fluconazole (DIFLUCAN) 150 MG tablet   Oral   Take 1 tablet (150 mg total) by mouth once. Repeat the dose in 7 days   2 tablet   0   . Guaifenesin (MUCINEX MAXIMUM STRENGTH) 1200 MG TB12   Oral   Take 1 tablet (1,200 mg total) by mouth 2 (two) times daily.   14 each   0   . metroNIDAZOLE (FLAGYL) 500 MG tablet   Oral   Take 1 tablet (500 mg total) by mouth 2 (two) times daily.   14 tablet   0   . Multiple Vitamin (MULITIVITAMIN WITH MINERALS) TABS   Oral   Take 1 tablet by mouth daily.         . pravastatin (PRAVACHOL) 40 MG tablet   Oral   Take 1 tablet (40 mg total) by mouth daily.   30 tablet   0     BP 113/71  Pulse 94  Temp(Src) 98.7 F (37.1  C) (Oral)  Resp 16  Ht 5\' 8"  (1.727 m)  Wt 212 lb 12.8 oz (96.525 kg)  BMI 32.36 kg/m2  SpO2 98%  LMP 12/24/2012  Physical Exam  Nursing note and vitals reviewed. Constitutional: She is oriented to person, place, and time. She appears well-developed and well-nourished.  Non-toxic appearance. No distress.  HENT:  Head: Normocephalic and atraumatic.  Eyes: Conjunctivae, EOM and lids are normal. Pupils are equal, round, and reactive to light.  Neck: Normal range of motion. Neck supple. No tracheal deviation present. No mass present.  Cardiovascular: Normal rate, regular rhythm and normal heart sounds.  Exam reveals no gallop.   No murmur heard. Pulmonary/Chest: Effort normal and breath sounds normal. No stridor. No respiratory distress. She has no decreased breath sounds. She has no wheezes. She has no rhonchi. She has no rales.  Abdominal: Soft. Normal appearance and bowel sounds are normal. She exhibits no distension. There is no tenderness. There is no rebound and no CVA tenderness.  Musculoskeletal: Normal  range of motion. She exhibits no edema and no tenderness.  Neurological: She is alert and oriented to person, place, and time. She has normal strength. No cranial nerve deficit or sensory deficit. GCS eye subscore is 4. GCS verbal subscore is 5. GCS motor subscore is 6.  Skin: Skin is warm and dry. No abrasion and no rash noted.  Psychiatric: She has a normal mood and affect. Her speech is normal and behavior is normal.    ED Course  Procedures (including critical care time)  Labs Reviewed - No data to display No results found.   No diagnosis found.    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

## 2013-01-31 NOTE — ED Notes (Signed)
Pt from home reports cough x4 days that started with horseness, but no sore throat. Pt adds that she has green nasal mucous, but minimal sputum production. Pt states that she has used inhaler at night for SOB, but doesn't know what med. Pt denies CP/tightness, hx of asthma, PNA. Pt ankles are normal with no edema and pt denies cardiac hx. Pt A&Ox4 and in NAD.

## 2013-09-02 ENCOUNTER — Other Ambulatory Visit (HOSPITAL_COMMUNITY): Payer: Self-pay | Admitting: Specialist

## 2013-09-02 DIAGNOSIS — Z1231 Encounter for screening mammogram for malignant neoplasm of breast: Secondary | ICD-10-CM

## 2013-10-10 ENCOUNTER — Other Ambulatory Visit (HOSPITAL_COMMUNITY): Payer: Self-pay | Admitting: Specialist

## 2013-10-10 ENCOUNTER — Ambulatory Visit (HOSPITAL_COMMUNITY)
Admission: RE | Admit: 2013-10-10 | Discharge: 2013-10-10 | Disposition: A | Discharge status: Home / Self Care | Payer: Medicaid Other | Source: Ambulatory Visit | Attending: Specialist | Admitting: Specialist

## 2013-10-10 DIAGNOSIS — Z1231 Encounter for screening mammogram for malignant neoplasm of breast: Secondary | ICD-10-CM | POA: Insufficient documentation

## 2013-11-04 ENCOUNTER — Emergency Department (HOSPITAL_COMMUNITY)
Admission: EM | Admit: 2013-11-04 | Discharge: 2013-11-04 | Disposition: A | Discharge status: Home / Self Care | Payer: Medicaid Other | Attending: Emergency Medicine | Admitting: Emergency Medicine

## 2013-11-04 ENCOUNTER — Encounter (HOSPITAL_COMMUNITY): Payer: Self-pay | Admitting: Emergency Medicine

## 2013-11-04 DIAGNOSIS — B9789 Other viral agents as the cause of diseases classified elsewhere: Secondary | ICD-10-CM | POA: Insufficient documentation

## 2013-11-04 DIAGNOSIS — R059 Cough, unspecified: Secondary | ICD-10-CM

## 2013-11-04 DIAGNOSIS — R05 Cough: Secondary | ICD-10-CM

## 2013-11-04 DIAGNOSIS — B349 Viral infection, unspecified: Secondary | ICD-10-CM

## 2013-11-04 DIAGNOSIS — J040 Acute laryngitis: Secondary | ICD-10-CM | POA: Insufficient documentation

## 2013-11-04 MED ORDER — ALBUTEROL SULFATE HFA 108 (90 BASE) MCG/ACT IN AERS
2.0000 | INHALATION_SPRAY | Freq: Once | RESPIRATORY_TRACT | Status: AC
  Administered 2013-11-04: 2 via RESPIRATORY_TRACT
  Filled 2013-11-04: qty 6.7

## 2013-11-04 NOTE — Discharge Instructions (Signed)
Please read and follow all provided instructions.  Your diagnoses today include:  1. Viral syndrome   2. Cough    You appear to have an upper respiratory infection (URI). An upper respiratory tract infection, or cold, is a viral infection of the air passages leading to the lungs. It should improve gradually after 5-7 days. You may have a lingering cough that lasts for 2- 4 weeks after the infection.  Tests performed today include:  Vital signs. See below for your results today.   Medications prescribed:   Albuterol inhaler - medication that opens up your airway  Use inhaler as follows: 1-2 puffs with spacer every 4 hours as needed for wheezing, cough, or shortness of breath.    Tessalon Perles - cough suppressant medication   Oxymetazoline - nasal spray for congestion. Do not use for more than 3 days because this medicine can cause rebound congestion.   Take any prescribed medications only as directed. Treatment for your infection is aimed at treating the symptoms. There are no medications, such as antibiotics, that will cure your infection.   Home care instructions:  Follow any educational materials contained in this packet.   Your illness is contagious and can be spread to others, especially during the first 3 or 4 days. It cannot be cured by antibiotics or other medicines. Take basic precautions such as washing your hands often, covering your mouth when you cough or sneeze, and avoiding public places where you could spread your illness to others.   Please continue drinking plenty of fluids.  Use over-the-counter medicines as needed as directed on packaging for symptom relief.  You may also use ibuprofen or tylenol as directed on packaging for pain or fever.  Do not take multiple medicines containing Tylenol or acetaminophen to avoid taking too much of this medication.  Follow-up instructions: Please follow-up with your primary care provider in the next 3 days for further  evaluation of your symptoms if you are not feeling better. If you do not have a primary care doctor -- see below for referral information.   Return instructions:   Please return to the Emergency Department if you experience worsening symptoms.   RETURN IMMEDIATELY IF you develop shortness of breath, confusion or altered mental status, a new rash, become dizzy, faint, or poorly responsive, or are unable to be cared for at home.  Please return if you have persistent vomiting and cannot keep down fluids or develop a fever that is not controlled by tylenol or motrin.    Please return if you have any other emergent concerns.  Additional Information:  Your vital signs today were: BP 131/78   Pulse 89   Temp(Src) 98.8 F (37.1 C) (Oral)   Resp 20   SpO2 98%   LMP 10/06/2013 If your blood pressure (BP) was elevated above 135/85 this visit, please have this repeated by your doctor within one month. --------------

## 2013-11-04 NOTE — ED Provider Notes (Signed)
CSN: [id number]     Arrival date & time 11/04/13  1023 History   First MD Initiated Contact with Patient 11/04/13 1037     Chief Complaint  Patient presents with  . Cough  . Laryngitis     (Consider location/radiation/quality/duration/timing/severity/associated sxs/prior Treatment) HPI Comments: Patient presents with one-day history of nonproductive cough, hoarse voice, and runny nose. Patient denies other symptoms including ear pain, headache. She's taken Tylenol at home for her symptoms. Patient has a history of bronchitis which was improved in the past with albuterol. She does not smoke or history of asthma. The onset of this condition was acute. The course is constant. Aggravating factors: none. Alleviating factors: none.    Patient is a 43 y.o. female presenting with cough. The history is provided by the patient.  Cough Associated symptoms: rhinorrhea   Associated symptoms: no chills, no ear pain, no fever, no headaches, no myalgias, no rash, no sore throat and no wheezing     History reviewed. No pertinent past medical history. Past Surgical History  Procedure Laterality Date  . Cesarean section    . Tubal ligation     Family History  Problem Relation Age of Onset  . Hypertension Mother   . Diabetes Mother   . Cancer Father     Stomach  . Heart disease Brother   . Alcohol abuse Paternal Aunt    History  Substance Use Topics  . Smoking status: Never Smoker   . Smokeless tobacco: Not on file  . Alcohol Use: No   OB History   Grav Para Term Preterm Abortions TAB SAB Ect Mult Living                 Review of Systems  Constitutional: Negative for fever, chills and fatigue.  HENT: Positive for congestion, rhinorrhea and voice change. Negative for ear pain, sinus pressure and sore throat.   Eyes: Negative for redness.  Respiratory: Positive for cough. Negative for wheezing.   Gastrointestinal: Negative for nausea, vomiting, abdominal pain and diarrhea.   Genitourinary: Negative for dysuria.  Musculoskeletal: Negative for myalgias and neck stiffness.  Skin: Negative for rash.  Neurological: Negative for headaches.  Hematological: Negative for adenopathy.    Allergies  Ketorolac tromethamine and Minocycline  Home Medications   Current Outpatient Rx  Name  Route  Sig  Dispense  Refill  . acetaminophen (TYLENOL) 500 MG tablet   Oral   Take 1,000 mg by mouth every 6 (six) hours as needed (pain).          BP 131/78  Pulse 89  Temp(Src) 98.8 F (37.1 C) (Oral)  Resp 20  SpO2 98%  LMP 11/02/2013  Physical Exam  Nursing note and vitals reviewed. Constitutional: She appears well-developed and well-nourished.  HENT:  Head: Normocephalic and atraumatic.  Right Ear: Tympanic membrane, external ear and ear canal normal.  Left Ear: Tympanic membrane, external ear and ear canal normal.  Nose: Mucosal edema present. No rhinorrhea.  Mouth/Throat: Uvula is midline, oropharynx is clear and moist and mucous membranes are normal. Mucous membranes are not dry. No oral lesions. No trismus in the jaw. No uvula swelling. No oropharyngeal exudate, posterior oropharyngeal edema, posterior oropharyngeal erythema or tonsillar abscesses.  Eyes: Conjunctivae are normal. Right eye exhibits no discharge. Left eye exhibits no discharge.  Neck: Normal range of motion. Neck supple.  Cardiovascular: Normal rate, regular rhythm and normal heart sounds.   Pulmonary/Chest: Effort normal and breath sounds normal. No respiratory distress. She has  no wheezes. She has no rales.  Abdominal: Soft. There is no tenderness.  Lymphadenopathy:    She has no cervical adenopathy.  Neurological: She is alert.  Skin: Skin is warm and dry.  Psychiatric: She has a normal mood and affect.   ED Course  Procedures (including critical care time) Labs Review Labs Reviewed - No data to display Imaging Review No results found.   EKG Interpretation None      10:54 AM  Patient seen and examined.    Vital signs reviewed and are as follows: Filed Vitals:   11/04/13 1042  BP: 131/78  Pulse: 89  Temp: 98.8 F (37.1 C)  Resp: 20   Patient counseled on supportive care for viral URI and s/s to return including worsening symptoms, persistent fever, persistent vomiting, or if they have any other concerns. Urged to see PCP if symptoms persist for more than 3 days. Patient verbalizes understanding and agrees with plan.   MDM   Final diagnoses:  Viral syndrome  Cough   Patient with symptoms consistent with a viral upper respiratory tract infection and laryngitis. Will treat conservatively. No indication for antibiotics. No fever or other systemic symptoms to suggest pneumonia. Patient appears well, nontoxic.    Renne CriglerJoshua Karry Causer, PA-C 11/04/13 1058

## 2013-11-04 NOTE — ED Provider Notes (Signed)
Medical screening examination/treatment/procedure(s) were performed by non-physician practitioner and as supervising physician I was immediately available for consultation/collaboration.   EKG Interpretation None       Ethelda ChickMartha K Linker, MD 11/04/13 1059

## 2013-11-04 NOTE — ED Notes (Signed)
Pt reports dry cough, denies headache or wheezing and sore throat. Stated that she feels short of breath when she is lying down

## 2013-11-16 ENCOUNTER — Emergency Department (HOSPITAL_COMMUNITY)
Admission: EM | Admit: 2013-11-16 | Discharge: 2013-11-16 | Disposition: A | Discharge status: Home / Self Care | Payer: Medicaid Other | Attending: Emergency Medicine | Admitting: Emergency Medicine

## 2013-11-16 ENCOUNTER — Encounter (HOSPITAL_COMMUNITY): Payer: Self-pay | Admitting: Emergency Medicine

## 2013-11-16 DIAGNOSIS — Z79899 Other long term (current) drug therapy: Secondary | ICD-10-CM | POA: Insufficient documentation

## 2013-11-16 DIAGNOSIS — J069 Acute upper respiratory infection, unspecified: Secondary | ICD-10-CM | POA: Insufficient documentation

## 2013-11-16 MED ORDER — AMOXICILLIN 500 MG PO CAPS: 1000.0000 mg | ORAL_CAPSULE | Freq: Two times a day (BID) | ORAL | Status: DC

## 2013-11-16 NOTE — ED Provider Notes (Signed)
CSN: [id number]     Arrival date & time 11/16/13  1553 History   First MD Initiated Contact with Patient 11/16/13 1619     Chief Complaint  Patient presents with  . Shortness of Breath     (Consider location/radiation/quality/duration/timing/severity/associated sxs/prior Treatment) Patient is a 43 y.o. female presenting with URI. The history is provided by the patient. No language interpreter was used.  URI Presenting symptoms: congestion, cough, rhinorrhea and sore throat   Presenting symptoms: no fever   Severity:  Moderate Duration:  3 weeks Associated symptoms: no myalgias   Associated symptoms comment:  Symptoms persistent for the past 3 weeks without fever. No nausea or vomiting.    No past medical history on file. Past Surgical History  Procedure Laterality Date  . Cesarean section    . Tubal ligation     Family History  Problem Relation Age of Onset  . Hypertension Mother   . Diabetes Mother   . Cancer Father     Stomach  . Heart disease Brother   . Alcohol abuse Paternal Aunt    History  Substance Use Topics  . Smoking status: Never Smoker   . Smokeless tobacco: Not on file  . Alcohol Use: No   OB History   Grav Para Term Preterm Abortions TAB SAB Ect Mult Living                 Review of Systems  Constitutional: Negative for fever and chills.  HENT: Positive for congestion, rhinorrhea and sore throat.   Respiratory: Positive for cough and shortness of breath.   Cardiovascular: Negative.   Gastrointestinal: Negative.   Musculoskeletal: Negative.  Negative for myalgias.  Skin: Negative.   Neurological: Negative.       Allergies  Ketorolac tromethamine and Minocycline  Home Medications   Current Outpatient Rx  Name  Route  Sig  Dispense  Refill  . acetaminophen (TYLENOL) 500 MG tablet   Oral   Take 1,000 mg by mouth every 6 (six) hours as needed (pain).         Marland Kitchen. albuterol (PROVENTIL HFA;VENTOLIN HFA) 108 (90 BASE) MCG/ACT inhaler  Inhalation   Inhale 2 puffs into the lungs every 6 (six) hours as needed for wheezing or shortness of breath.         . loratadine (CLARITIN) 10 MG tablet   Oral   Take 10 mg by mouth daily.          BP 129/76  Pulse 89  Temp(Src) 97.9 F (36.6 C) (Oral)  Resp 16  SpO2 100%  LMP 11/02/2013 Physical Exam  Constitutional: She is oriented to person, place, and time. She appears well-developed and well-nourished.  HENT:  Head: Normocephalic.  Nose: Mucosal edema present.  Mouth/Throat: Mucous membranes are normal. Posterior oropharyngeal erythema present. No posterior oropharyngeal edema.  Neck: Normal range of motion. Neck supple.  Cardiovascular: Normal rate and regular rhythm.   Pulmonary/Chest: Effort normal and breath sounds normal. She has no wheezes. She has no rales.  Abdominal: Soft. Bowel sounds are normal. There is no tenderness. There is no rebound and no guarding.  Musculoskeletal: Normal range of motion.  Neurological: She is alert and oriented to person, place, and time.  Skin: Skin is warm and dry. No rash noted.  Psychiatric: She has a normal mood and affect.    ED Course  Procedures (including critical care time) Labs Review Labs Reviewed - No data to display Imaging Review No results found.  EKG Interpretation None      MDM   Final diagnoses:  None    1. URI  Will opt to treat with abx secondary to duration of symptoms. Encouraged PCP follow up for persistent symptoms.     Arnoldo Hooker, PA-C 11/16/13 1705

## 2013-11-16 NOTE — ED Notes (Addendum)
Pt presents with complaint of shortness of breath, nasal congestion, and hoarseness. Pt states she was seen here on March 10th for the same, however the patient states the symptoms have worsened. Pt states she is now having a productive cough, sore throat, and her hoarseness has gotten worse. Pt states that when she was seen on 11/04/13 the shortness of breath was mostly at night, however now she reports being short of breath during the day. Pt ambulated to exam room without difficulty and has an oxygen saturation of 100% on room air. Pt denies generalized pain, chest pain, nausea, emesis, or diarrhea.

## 2013-11-16 NOTE — Discharge Instructions (Signed)
RECOMMEND MUCINEX (OVER-THE-COUNTER), TYLENOL AND/OR IBUPROFEN, PLENTY OF FLUIDS. FOLLOW UP WITH DR. Mayford KnifeWILLIAMS IF SYMPTOMS PERSIST FOR RECHECK.    Cough, Adult  A cough is a reflex that helps clear your throat and airways. It can help heal the body or may be a reaction to an irritated airway. A cough may only last 2 or 3 weeks (acute) or may last more than 8 weeks (chronic).  CAUSES Acute cough:  Viral or bacterial infections. Chronic cough:  Infections.  Allergies.  Asthma.  Post-nasal drip.  Smoking.  Heartburn or acid reflux.  Some medicines.  Chronic lung problems (COPD).  Cancer. SYMPTOMS   Cough.  Fever.  Chest pain.  Increased breathing rate.  High-pitched whistling sound when breathing (wheezing).  Colored mucus that you cough up (sputum). TREATMENT   A bacterial cough may be treated with antibiotic medicine.  A viral cough must run its course and will not respond to antibiotics.  Your caregiver may recommend other treatments if you have a chronic cough. HOME CARE INSTRUCTIONS   Only take over-the-counter or prescription medicines for pain, discomfort, or fever as directed by your caregiver. Use cough suppressants only as directed by your caregiver.  Use a cold steam vaporizer or humidifier in your bedroom or home to help loosen secretions.  Sleep in a semi-upright position if your cough is worse at night.  Rest as needed.  Stop smoking if you smoke. SEEK IMMEDIATE MEDICAL CARE IF:   You have pus in your sputum.  Your cough starts to worsen.  You cannot control your cough with suppressants and are losing sleep.  You begin coughing up blood.  You have difficulty breathing.  You develop pain which is getting worse or is uncontrolled with medicine.  You have a fever. MAKE SURE YOU:   Understand these instructions.  Will watch your condition.  Will get help right away if you are not doing well or get worse. Document Released:  02/10/2011 Document Revised: 11/06/2011 Document Reviewed: 02/10/2011 [facility] Patient Information 2014 [place], [place]. Upper Respiratory Infection, Adult An upper respiratory infection (URI) is also known as the common cold. It is often caused by a type of germ (virus). Colds are easily spread (contagious). You can pass it to others by kissing, coughing, sneezing, or drinking out of the same glass. Usually, you get better in 1 or 2 weeks.  HOME CARE   Only take medicine as told by your doctor.  Use a warm mist humidifier or breathe in steam from a hot shower.  Drink enough water and fluids to keep your pee (urine) clear or pale yellow.  Get plenty of rest.  Return to work when your temperature is back to normal or as told by your doctor. You may use a face mask and wash your hands to stop your cold from spreading. GET HELP RIGHT AWAY IF:   After the first few days, you feel you are getting worse.  You have questions about your medicine.  You have chills, shortness of breath, or brown or red spit (mucus).  You have yellow or brown snot (nasal discharge) or pain in the face, especially when you bend forward.  You have a fever, puffy (swollen) neck, pain when you swallow, or white spots in the back of your throat.  You have a bad headache, ear pain, sinus pain, or chest pain.  You have a high-pitched whistling sound when you breathe in and out (wheezing).  You have a lasting cough or cough up blood.  You have sore muscles or a stiff neck. MAKE SURE YOU:   Understand these instructions.  Will watch your condition.  Will get help right away if you are not doing well or get worse. Document Released: 01/31/2008 Document Revised: 11/06/2011 Document Reviewed: 12/19/2010 [facility] Patient Information 2014 [place], [place].

## 2013-11-16 NOTE — ED Provider Notes (Signed)
History/physical exam/procedure(s) were performed by non-physician practitioner and as supervising physician I was immediately available for consultation/collaboration. I have reviewed all notes and am in agreement with care and plan.   Danah Reinecke S Drue Camera, MD 11/16/13 2315 

## 2013-12-09 ENCOUNTER — Encounter (HOSPITAL_COMMUNITY): Payer: Self-pay | Admitting: Emergency Medicine

## 2013-12-09 ENCOUNTER — Emergency Department (HOSPITAL_COMMUNITY)
Admission: EM | Admit: 2013-12-09 | Discharge: 2013-12-09 | Disposition: A | Discharge status: Home / Self Care | Payer: Medicaid Other | Attending: Emergency Medicine | Admitting: Emergency Medicine

## 2013-12-09 DIAGNOSIS — I10 Essential (primary) hypertension: Secondary | ICD-10-CM | POA: Insufficient documentation

## 2013-12-09 DIAGNOSIS — R42 Dizziness and giddiness: Secondary | ICD-10-CM | POA: Insufficient documentation

## 2013-12-09 DIAGNOSIS — Z79899 Other long term (current) drug therapy: Secondary | ICD-10-CM | POA: Insufficient documentation

## 2013-12-09 DIAGNOSIS — E875 Hyperkalemia: Secondary | ICD-10-CM | POA: Insufficient documentation

## 2013-12-09 DIAGNOSIS — Z3202 Encounter for pregnancy test, result negative: Secondary | ICD-10-CM | POA: Insufficient documentation

## 2013-12-09 LAB — URINALYSIS, ROUTINE W REFLEX MICROSCOPIC
Bilirubin Urine: NEGATIVE
Glucose, UA: NEGATIVE mg/dL
Ketones, ur: NEGATIVE mg/dL
NITRITE: NEGATIVE
PROTEIN: NEGATIVE mg/dL
Specific Gravity, Urine: 1.014 (ref 1.005–1.030)
UROBILINOGEN UA: 1 mg/dL (ref 0.0–1.0)
pH: 7.5 (ref 5.0–8.0)

## 2013-12-09 LAB — I-STAT CHEM 8, ED
BUN: 7 mg/dL (ref 6–23)
Calcium, Ion: 0.78 mmol/L — ABNORMAL LOW (ref 1.12–1.23)
Chloride: 110 mEq/L (ref 96–112)
Creatinine, Ser: 0.8 mg/dL (ref 0.50–1.10)
Glucose, Bld: 87 mg/dL (ref 70–99)
HEMATOCRIT: 42 % (ref 36.0–46.0)
Hemoglobin: 14.3 g/dL (ref 12.0–15.0)
Potassium: 6.2 mEq/L — ABNORMAL HIGH (ref 3.7–5.3)
SODIUM: 135 meq/L — AB (ref 137–147)
TCO2: 17 mmol/L (ref 0–100)

## 2013-12-09 LAB — CBG MONITORING, ED: GLUCOSE-CAPILLARY: 90 mg/dL (ref 70–99)

## 2013-12-09 LAB — URINE MICROSCOPIC-ADD ON

## 2013-12-09 LAB — POC URINE PREG, ED: Preg Test, Ur: NEGATIVE

## 2013-12-09 LAB — POTASSIUM: Potassium: 4 mEq/L (ref 3.7–5.3)

## 2013-12-09 NOTE — ED Notes (Signed)
Pt presents with NAD. Pt c/o dizziness x 1 day. Pt states the feeling "comes and goes" . Pt denies NVD and fever . Believes her BP is high. Took it at school 122/74

## 2013-12-09 NOTE — ED Provider Notes (Signed)
CSN: [id number]     Arrival date & time 12/09/13  1055 History   First MD Initiated Contact with Patient 12/09/13 1115     Chief Complaint  Patient presents with  . Hypertension  . Dizziness     (Consider location/radiation/quality/duration/timing/severity/associated sxs/prior Treatment) HPI Comments: Patient is a 43 year old otherwise healthy female who presents today for episodes of lightheadedness that began yesterday. These episodes have come on multiple times since yesterday and last for a few seconds when they come on. She cannot attribute any triggers to these episodes. They resolved spontaneously. She has never had this in the past. No new medications, foods, stress. She felt as though these episodes were related to hypertension. She has measured her BP as high as 135/78. She denies chest pain, shortness of breath, nausea, vomiting, diarrhea, abdominal pain, fever.   The history is provided by the patient. No language interpreter was used.    History reviewed. No pertinent past medical history. Past Surgical History  Procedure Laterality Date  . Cesarean section    . Tubal ligation    . Cesarean section    . Tubal ligation     Family History  Problem Relation Age of Onset  . Hypertension Mother   . Diabetes Mother   . Cancer Father     Stomach  . Heart disease Brother   . Alcohol abuse Paternal Aunt    History  Substance Use Topics  . Smoking status: Never Smoker   . Smokeless tobacco: Not on file  . Alcohol Use: No   OB History   Grav Para Term Preterm Abortions TAB SAB Ect Mult Living                 Review of Systems  Constitutional: Negative for fever and chills.  Respiratory: Negative for shortness of breath.   Cardiovascular: Negative for chest pain.  Gastrointestinal: Negative for nausea, vomiting and abdominal pain.  Skin: Negative for rash.  Neurological: Positive for light-headedness. Negative for dizziness, seizures, numbness and headaches.  All  other systems reviewed and are negative.     Allergies  Ketorolac tromethamine and Minocycline  Home Medications   Prior to Admission medications   Medication Sig Start Date End Date Taking? Authorizing Provider  acetaminophen (TYLENOL) 500 MG tablet Take 1,000 mg by mouth every 6 (six) hours as needed (pain).   Yes Historical Provider, MD  albuterol (PROVENTIL HFA;VENTOLIN HFA) 108 (90 BASE) MCG/ACT inhaler Inhale 2 puffs into the lungs every 6 (six) hours as needed for wheezing or shortness of breath.   Yes Historical Provider, MD  loratadine (CLARITIN) 10 MG tablet Take 10 mg by mouth daily.   Yes Historical Provider, MD   BP 119/67  Pulse 85  Temp(Src) 98.1 F (36.7 C) (Oral)  Resp 18  Ht 5\' 8"  (1.727 m)  Wt 212 lb (96.163 kg)  BMI 32.24 kg/m2  SpO2 97%  LMP 12/04/2013 Physical Exam  Nursing note and vitals reviewed. Constitutional: She is oriented to person, place, and time. She appears well-developed and well-nourished.  Non-toxic appearance. She does not have a sickly appearance. She does not appear ill. No distress.  Well appearing  HENT:  Head: Normocephalic and atraumatic.  Right Ear: Tympanic membrane, external ear and ear canal normal.  Left Ear: Tympanic membrane, external ear and ear canal normal.  Nose: Nose normal.  Mouth/Throat: Uvula is midline and oropharynx is clear and moist.  No temporal artery tenderness  Eyes: Conjunctivae and EOM are normal. Pupils  are equal, round, and reactive to light.  Neck: Normal range of motion.  No nuchal rigidity or meningeal signs  Cardiovascular: Normal rate, regular rhythm and normal heart sounds.   Pulmonary/Chest: Effort normal and breath sounds normal. No stridor. No respiratory distress. She has no wheezes. She has no rales.  Abdominal: Soft. She exhibits no distension. There is no tenderness.  Musculoskeletal: Normal range of motion.  Neurological: She is alert and oriented to person, place, and time. She has  normal strength. No sensory deficit. Coordination and gait normal. GCS eye subscore is 4. GCS verbal subscore is 5. GCS motor subscore is 6.  Finger nose finger normal. Grip strength 5/5 bilaterally. No facial droop.   Skin: Skin is warm and dry. She is not diaphoretic. No erythema.  Psychiatric: She has a normal mood and affect. Her behavior is normal.    ED Course  Procedures (including critical care time) Labs Review Labs Reviewed  URINALYSIS, ROUTINE W REFLEX MICROSCOPIC - Abnormal; Notable for the following:    APPearance CLOUDY (*)    Hgb urine dipstick TRACE (*)    Leukocytes, UA LARGE (*)    All other components within normal limits  URINE MICROSCOPIC-ADD ON - Abnormal; Notable for the following:    Squamous Epithelial / LPF MANY (*)    Bacteria, UA MANY (*)    All other components within normal limits  I-STAT CHEM 8, ED - Abnormal; Notable for the following:    Sodium 135 (*)    Potassium 6.2 (*)    Calcium, Ion 0.78 (*)    All other components within normal limits  POTASSIUM  CBG MONITORING, ED  POC URINE PREG, ED    Imaging Review No results found.   EKG Interpretation   Date/Time:  Tuesday December 09 2013 12:21:11 EDT Ventricular Rate:  69 PR Interval:  175 QRS Duration: 83 QT Interval:  415 QTC Calculation: 445 R Axis:   23 Text Interpretation:  Sinus rhythm Abnormal R-wave progression, early  transition Borderline T wave abnormalities Baseline wander in lead(s) II  III aVF V6      MDM   Final diagnoses:  Lightheadedness    Patient presents to ED for evaluation of lightheadedness. EKG and labs are unremarkable. Initially patient was found to be hyperkalemic which was likely due to hemolysis. K of 4.0 on repeat test. Patient is not orthostatic. Patient can walk without ataxic gait. Neuro exam is unremarkable. No other symptoms. Patient was instructed to follow up with PCP. Return instructions given. Vital sign stable for discharge. Dr. Denton LankSteinl evaluated  patient and agrees with plan. Patient / Family / Caregiver informed of clinical course, understand medical decision-making process, and agree with plan.    Mora BellmanHannah S Izela Altier, PA-C 12/09/13 [phone]

## 2013-12-09 NOTE — Discharge Instructions (Signed)
Dizziness ° Dizziness means you feel unsteady or lightheaded. You might feel like you are going to pass out (faint). °HOME CARE  °· Drink enough fluids to keep your pee (urine) clear or pale yellow. °· Take your medicines exactly as told by your doctor. If you take blood pressure medicine, always stand up slowly from the lying or sitting position. Hold on to something to steady yourself. °· If you need to stand in one place for a long time, move your legs often. Tighten and relax your leg muscles. °· Have someone stay with you until you feel okay. °· Do not drive or use heavy machinery if you feel dizzy. °· Do not drink alcohol. °GET HELP RIGHT AWAY IF:  °· You feel dizzy or lightheaded and it gets worse. °· You feel sick to your stomach (nauseous), or you throw up (vomit). °· You have trouble talking or walking. °· You feel weak or have trouble using your arms, hands, or legs. °· You cannot think clearly or have trouble forming sentences. °· You have chest pain, belly (abdominal) pain, sweating, or you are short of breath. °· Your vision changes. °· You are bleeding. °· You have problems from your medicine that seem to be getting worse. °MAKE SURE YOU:  °· Understand these instructions. °· Will watch your condition. °· Will get help right away if you are not doing well or get worse. °Document Released: 08/03/2011 Document Revised: 11/06/2011 Document Reviewed: 08/03/2011 °ExitCare® Patient Information ©2014 [employer]. ° °

## 2013-12-09 NOTE — ED Notes (Addendum)
Per patient- yesterday morning started feeling lightheadedness that comes and goes. Denies any hx of HTN or high blood sugar. States she feels like her "head is swimming." CBG WDL on check. Denies pain. Denies precipitating events. Does not currently take any medications for blood pressure and has not started any new medications. VSS. A&Ox4. Resting comfortably.

## 2013-12-11 NOTE — ED Provider Notes (Signed)
Medical screening examination/treatment/procedure(s) were conducted as a shared visit with non-physician practitioner(s) and myself.  I personally evaluated the patient during the encounter.   EKG Interpretation   Date/Time:  Tuesday December 09 2013 12:21:11 EDT Ventricular Rate:  69 PR Interval:  175 QRS Duration: 83 QT Interval:  415 QTC Calculation: 445 R Axis:   23 Text Interpretation:  Sinus rhythm Abnormal R-wave progression, early  transition Borderline T wave abnormalities Baseline wander in lead(s) II  III aVF V6 ED PHYSICIAN INTERPRETATION AVAILABLE IN [facility]  Confirmed by TEST, Record ([id number]) on 12/11/2013 7:05:07 AM      Pt c/o transiently episodes dizziness, which describes as lightheadedness. No palpitations or irregular heartbeat. No cp. No sob. Alert, content, nad. No faintness or dizziness currently. nsr on monitor. Labs.   Suzi RootsKevin E Pearley Millington, MD 12/11/13 [phone]

## 2013-12-30 IMAGING — CR DG CHEST 2V
2 series · 2 of 2 positions shown · non-contrast
Comparison: One-view chest [DATE].

CLINICAL DATA: Heaviness and chest.  Difficulty breathing when
lying down.

CHEST - 2 VIEW

[w chest pa]
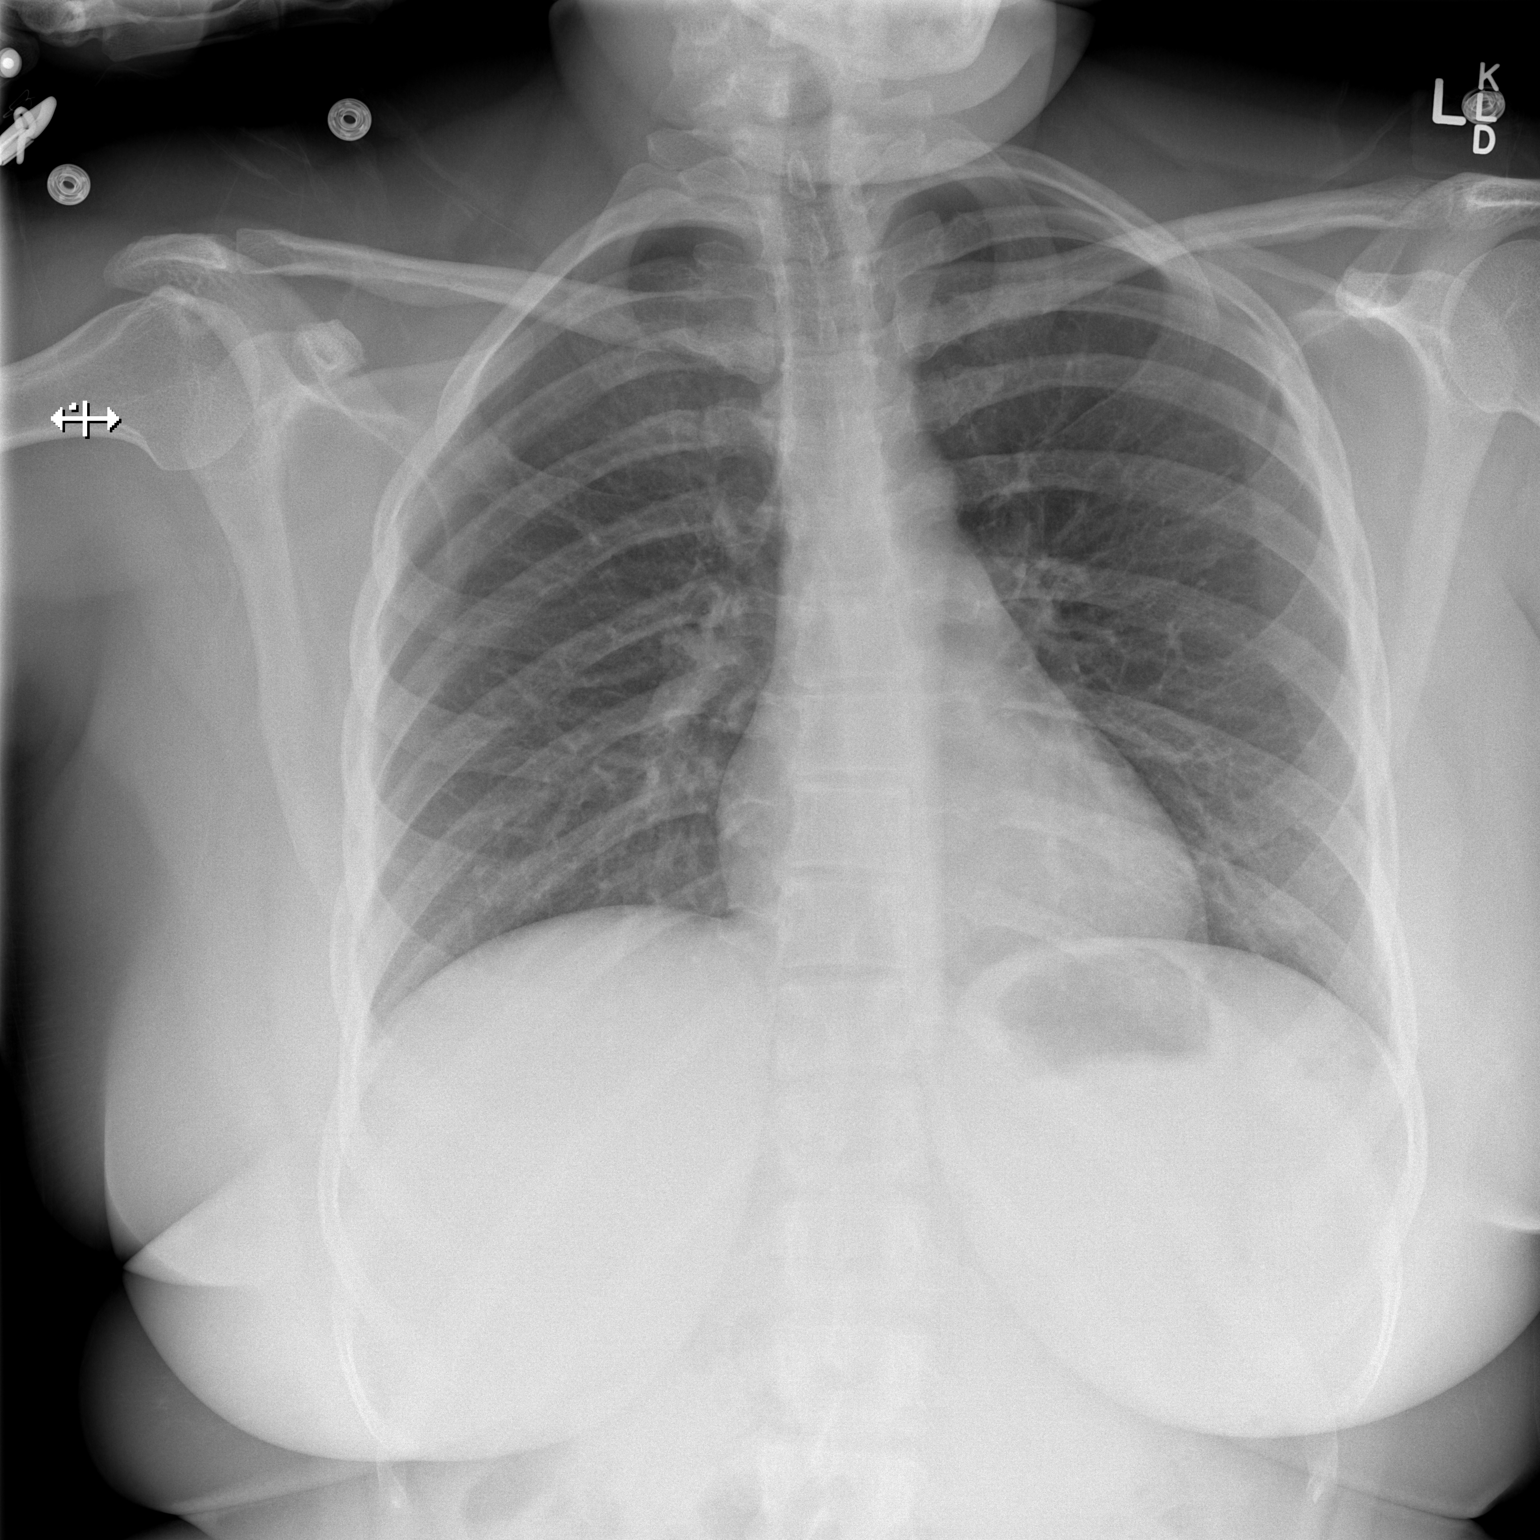

[w chest lat]
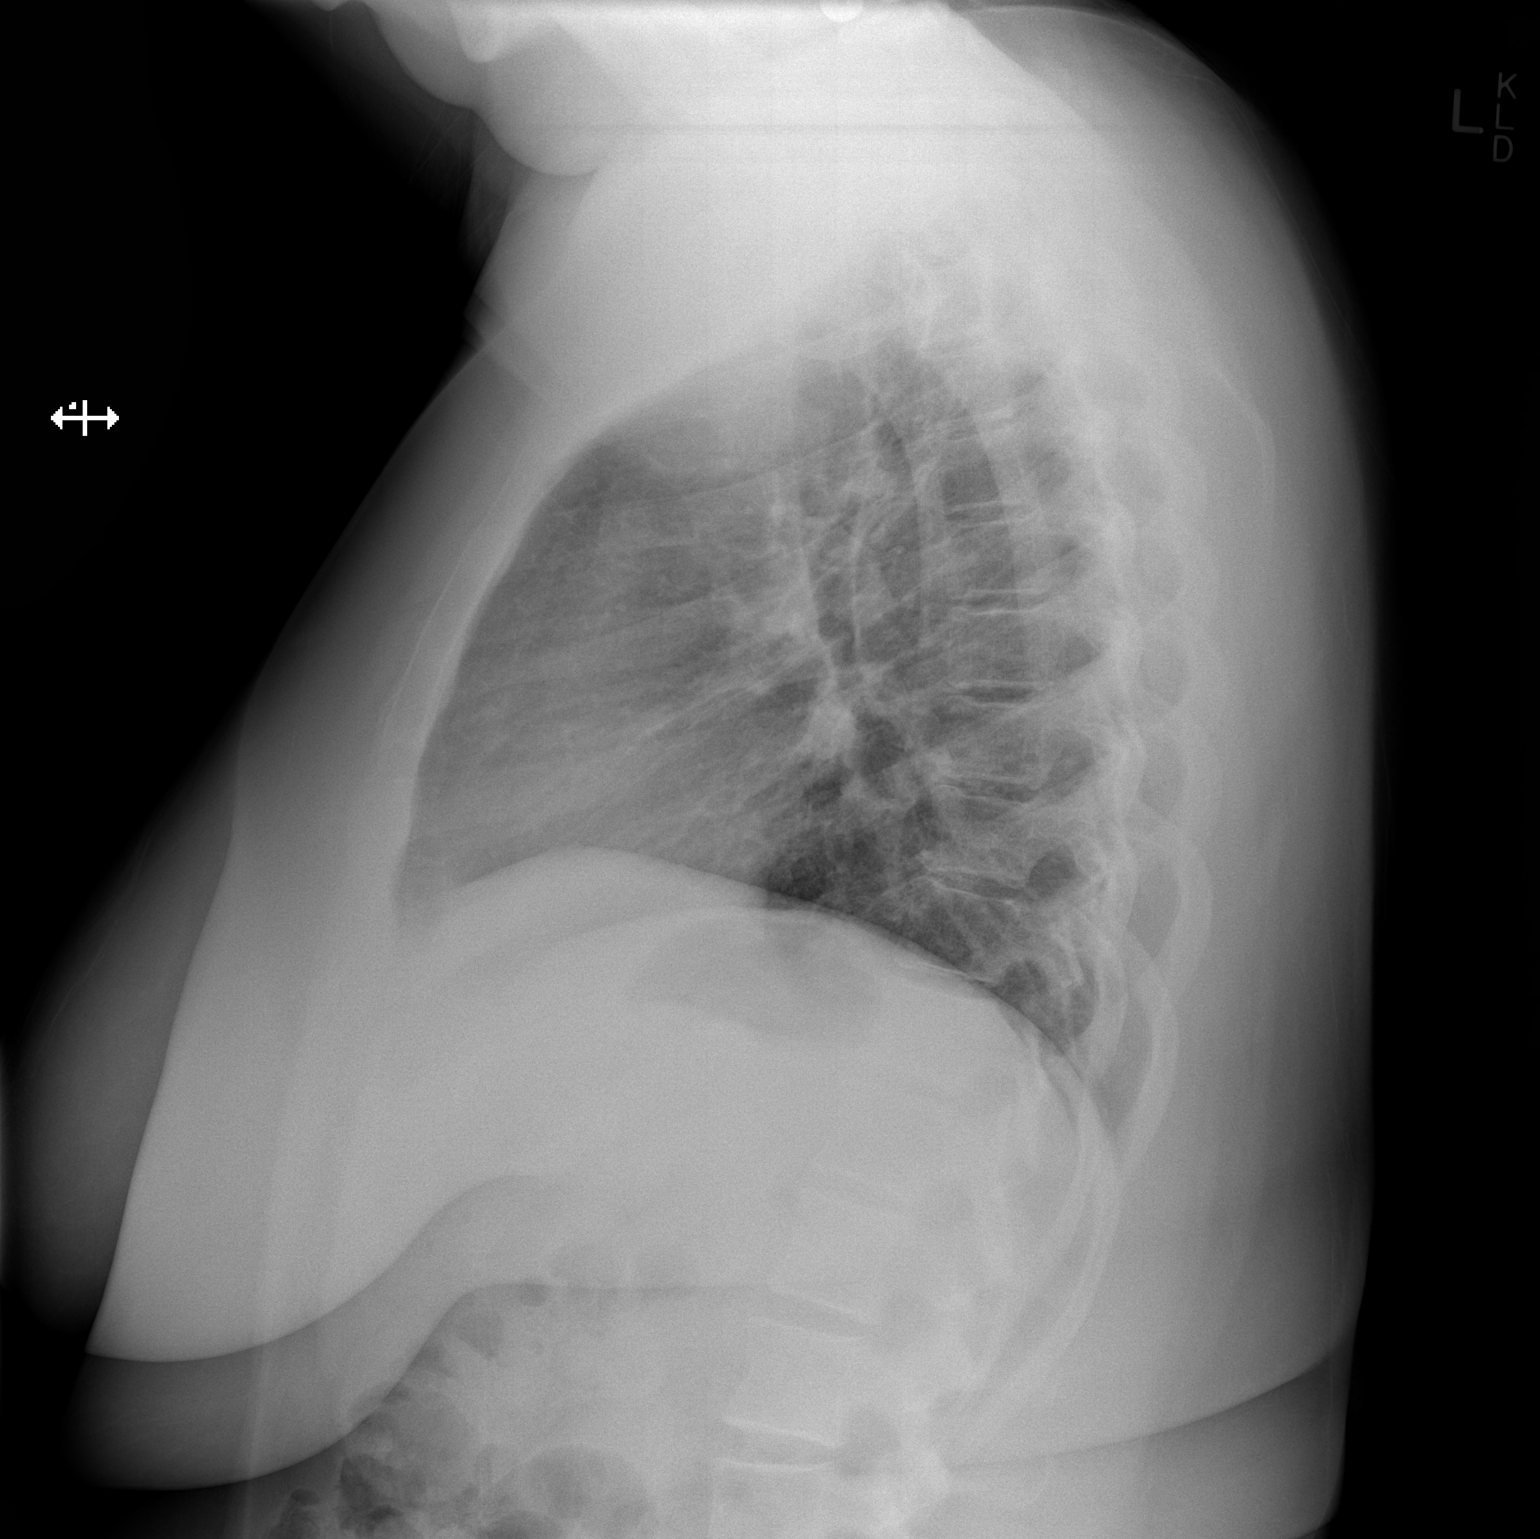

[2 of 2 positions shown; findings below may reference images not displayed]

FINDINGS: The heart size is normal.  The lungs are clear.  The
visualized soft tissues and bony thorax are unremarkable.
IMPRESSION: Negative two-view chest.

## 2014-04-14 ENCOUNTER — Emergency Department (HOSPITAL_COMMUNITY)
Admission: EM | Admit: 2014-04-14 | Discharge: 2014-04-14 | Disposition: A | Discharge status: Home / Self Care | Payer: Medicaid Other | Attending: Emergency Medicine | Admitting: Emergency Medicine

## 2014-04-14 ENCOUNTER — Encounter (HOSPITAL_COMMUNITY): Payer: Self-pay | Admitting: Emergency Medicine

## 2014-04-14 ENCOUNTER — Emergency Department (HOSPITAL_COMMUNITY): Payer: Medicaid Other

## 2014-04-14 DIAGNOSIS — Z79899 Other long term (current) drug therapy: Secondary | ICD-10-CM | POA: Diagnosis not present

## 2014-04-14 DIAGNOSIS — Z3202 Encounter for pregnancy test, result negative: Secondary | ICD-10-CM | POA: Diagnosis not present

## 2014-04-14 DIAGNOSIS — R071 Chest pain on breathing: Secondary | ICD-10-CM | POA: Insufficient documentation

## 2014-04-14 DIAGNOSIS — R109 Unspecified abdominal pain: Secondary | ICD-10-CM | POA: Diagnosis not present

## 2014-04-14 DIAGNOSIS — R0602 Shortness of breath: Secondary | ICD-10-CM | POA: Diagnosis not present

## 2014-04-14 DIAGNOSIS — R110 Nausea: Secondary | ICD-10-CM | POA: Insufficient documentation

## 2014-04-14 DIAGNOSIS — R079 Chest pain, unspecified: Secondary | ICD-10-CM | POA: Insufficient documentation

## 2014-04-14 DIAGNOSIS — Z9851 Tubal ligation status: Secondary | ICD-10-CM | POA: Insufficient documentation

## 2014-04-14 LAB — COMPREHENSIVE METABOLIC PANEL
ALK PHOS: 73 U/L (ref 39–117)
ALT: 25 U/L (ref 0–35)
AST: 27 U/L (ref 0–37)
Albumin: 3.8 g/dL (ref 3.5–5.2)
Anion gap: 14 (ref 5–15)
BUN: 7 mg/dL (ref 6–23)
CHLORIDE: 104 meq/L (ref 96–112)
CO2: 23 meq/L (ref 19–32)
CREATININE: 0.77 mg/dL (ref 0.50–1.10)
Calcium: 9.4 mg/dL (ref 8.4–10.5)
GFR calc Af Amer: >90 mL/min (ref >90)
Glucose, Bld: 100 mg/dL — ABNORMAL HIGH (ref 70–99)
Potassium: 3.5 mEq/L — ABNORMAL LOW (ref 3.7–5.3)
Sodium: 141 mEq/L (ref 137–147)
Total Bilirubin: 0.8 mg/dL (ref 0.3–1.2)
Total Protein: 7.5 g/dL (ref 6.0–8.3)

## 2014-04-14 LAB — CBC WITH DIFFERENTIAL/PLATELET
Basophils Absolute: 0 10*3/uL (ref 0.0–0.1)
Basophils Relative: 0 % (ref 0–1)
Eosinophils Absolute: 0.1 10*3/uL (ref 0.0–0.7)
Eosinophils Relative: 2 % (ref 0–5)
HEMATOCRIT: 40 % (ref 36.0–46.0)
HEMOGLOBIN: 13.9 g/dL (ref 12.0–15.0)
LYMPHS PCT: 48 % — AB (ref 12–46)
Lymphs Abs: 1.9 10*3/uL (ref 0.7–4.0)
MCH: 31.6 pg (ref 26.0–34.0)
MCHC: 34.8 g/dL (ref 30.0–36.0)
MCV: 90.9 fL (ref 78.0–100.0)
Monocytes Absolute: 0.2 10*3/uL (ref 0.1–1.0)
Monocytes Relative: 6 % (ref 3–12)
NEUTROS ABS: 1.7 10*3/uL (ref 1.7–7.7)
Neutrophils Relative %: 44 % (ref 43–77)
Platelets: 261 10*3/uL (ref 150–400)
RBC: 4.4 MIL/uL (ref 3.87–5.11)
RDW: 12.4 % (ref 11.5–15.5)
WBC: 3.8 10*3/uL — AB (ref 4.0–10.5)

## 2014-04-14 LAB — URINALYSIS, ROUTINE W REFLEX MICROSCOPIC
Bilirubin Urine: NEGATIVE
Glucose, UA: NEGATIVE mg/dL
Ketones, ur: 15 mg/dL — AB
Nitrite: NEGATIVE
PH: 6.5 (ref 5.0–8.0)
Protein, ur: NEGATIVE mg/dL
SPECIFIC GRAVITY, URINE: 1.017 (ref 1.005–1.030)
Urobilinogen, UA: 1 mg/dL (ref 0.0–1.0)

## 2014-04-14 LAB — URINE MICROSCOPIC-ADD ON

## 2014-04-14 LAB — LIPASE, BLOOD: Lipase: 46 U/L (ref 11–59)

## 2014-04-14 LAB — I-STAT TROPONIN, ED: Troponin i, poc: 0 ng/mL (ref 0.00–0.08)

## 2014-04-14 LAB — PREGNANCY, URINE: PREG TEST UR: NEGATIVE

## 2014-04-14 MED ORDER — DIPHENHYDRAMINE HCL 50 MG/ML IJ SOLN
25.0000 mg | Freq: Once | INTRAMUSCULAR | Status: AC
  Administered 2014-04-14: 25 mg via INTRAVENOUS
  Filled 2014-04-14: qty 1

## 2014-04-14 MED ORDER — OMEPRAZOLE 20 MG PO CPDR: 20.0000 mg | DELAYED_RELEASE_CAPSULE | Freq: Every day | ORAL | Status: DC

## 2014-04-14 MED ORDER — ONDANSETRON 8 MG PO TBDP: 8.0000 mg | ORAL_TABLET | Freq: Three times a day (TID) | ORAL | Status: DC | PRN

## 2014-04-14 MED ORDER — ONDANSETRON HCL 4 MG PO TABS: 4.0000 mg | ORAL_TABLET | Freq: Once | ORAL | Status: DC

## 2014-04-14 MED ORDER — HYDROMORPHONE HCL PF 1 MG/ML IJ SOLN
1.0000 mg | Freq: Once | INTRAMUSCULAR | Status: AC
  Administered 2014-04-14: 1 mg via INTRAVENOUS
  Filled 2014-04-14: qty 1

## 2014-04-14 MED ORDER — ONDANSETRON HCL 4 MG/2ML IJ SOLN
4.0000 mg | Freq: Once | INTRAMUSCULAR | Status: AC
  Administered 2014-04-14: 4 mg via INTRAVENOUS
  Filled 2014-04-14: qty 2

## 2014-04-14 NOTE — ED Notes (Signed)
MD bedside

## 2014-04-14 NOTE — ED Notes (Signed)
IV attempt X 2 w/o success, 2nd RN to attempt

## 2014-04-14 NOTE — Discharge Instructions (Signed)

## 2014-04-14 NOTE — ED Notes (Signed)
PAtient will give urine sample once nurse is finish with IV

## 2014-04-14 NOTE — ED Provider Notes (Signed)
CSN: [id number]     Arrival date & time 04/14/14  1322 History   First MD Initiated Contact with Patient 04/14/14 1328     Chief Complaint  Patient presents with  . Chest Pain     (Consider location/radiation/quality/duration/timing/severity/associated sxs/prior Treatment) Patient is a 43 y.o. female presenting with chest pain.  Chest Pain Associated symptoms: abdominal pain, nausea and shortness of breath   Associated symptoms: no diaphoresis, no fever, no headache, no numbness, no palpitations, not vomiting and no weakness     Ms Twitty is a 43 year old woman with no notable PMH presents with chest pain. She was in her usual state of health when this morning she was on the phone and had substernal chest pain come on suddenly. It is a cramping pain that radiates to her L chest and down her midline chest to her epigastric area. Nothing makes it better or worse. It lasted about one hour with associated nausea no emesis, SOB. No diaphoresis. She has never had an episode like this. She also shared that she is very anxious as she is in school, works two jobs, and has children to take care of. She also just moved this weekend.  Denies drugs, alcohol, trauma. She does note that her brother died in his 30s from an "enlarged heart."  History reviewed. No pertinent past medical history. Past Surgical History  Procedure Laterality Date  . Cesarean section    . Tubal ligation    . Cesarean section    . Tubal ligation     Family History  Problem Relation Age of Onset  . Hypertension Mother   . Diabetes Mother   . Cancer Father     Stomach  . Heart disease Brother   . Alcohol abuse Paternal Aunt    History  Substance Use Topics  . Smoking status: Never Smoker   . Smokeless tobacco: Not on file  . Alcohol Use: No   OB History   Grav Para Term Preterm Abortions TAB SAB Ect Mult Living                 Review of Systems  Constitutional: Negative for fever, chills and diaphoresis.   Respiratory: Positive for shortness of breath.   Cardiovascular: Positive for chest pain. Negative for palpitations.  Gastrointestinal: Positive for nausea and abdominal pain. Negative for vomiting, diarrhea, constipation and blood in stool.  Skin:       Diffuse pruritis  Neurological: Negative for syncope, weakness, numbness and headaches.      Allergies  Ketorolac tromethamine and Minocycline  Home Medications   Prior to Admission medications   Medication Sig Start Date End Date Taking? Authorizing Provider  acetaminophen (TYLENOL) 500 MG tablet Take 1,000 mg by mouth every 6 (six) hours as needed (pain).   Yes Historical Provider, MD  albuterol (PROVENTIL HFA;VENTOLIN HFA) 108 (90 BASE) MCG/ACT inhaler Inhale 2 puffs into the lungs every 6 (six) hours as needed for wheezing or shortness of breath.   Yes Historical Provider, MD  loratadine (CLARITIN) 10 MG tablet Take 10 mg by mouth daily as needed for allergies.    Yes Historical Provider, MD  omeprazole (PRILOSEC) 20 MG capsule Take 1 capsule (20 mg total) by mouth daily. 04/14/14   Lyanne Co, MD  ondansetron (ZOFRAN ODT) 8 MG disintegrating tablet Take 1 tablet (8 mg total) by mouth every 8 (eight) hours as needed for nausea or vomiting. 04/14/14   Lyanne Co, MD   BP  101/62  Pulse 58  Temp(Src) 97.9 F (36.6 C) (Oral)  Resp 12  Wt 215 lb (97.523 kg)  SpO2 98%  LMP 03/10/2014 Physical Exam  Vitals reviewed. Constitutional: She is oriented to person, place, and time. She appears well-developed and well-nourished. No distress.  HENT:  Head: Normocephalic and atraumatic.  Mouth/Throat: Oropharynx is clear and moist.  Eyes: EOM are normal. Pupils are equal, round, and reactive to light.  Cardiovascular: Normal rate, regular rhythm, normal heart sounds and intact distal pulses.  Exam reveals no gallop and no friction rub.   No murmur heard. Pulmonary/Chest: Effort normal and breath sounds normal. No respiratory  distress.  Abdominal: Soft. Bowel sounds are normal. She exhibits no distension. There is tenderness.  Diffuse tenderness in all quadrants, no CVA tenderness  Musculoskeletal: She exhibits no edema.  Neurological: She is alert and oriented to person, place, and time.  Skin: She is not diaphoretic.  Bilateral palms pink/red  Psychiatric:  anxious    ED Course  Procedures (including critical care time) Labs Review Labs Reviewed  CBC WITH DIFFERENTIAL - Abnormal; Notable for the following:    WBC 3.8 (*)    Lymphocytes Relative 48 (*)    All other components within normal limits  COMPREHENSIVE METABOLIC PANEL - Abnormal; Notable for the following:    Potassium 3.5 (*)    Glucose, Bld 100 (*)    All other components within normal limits  URINALYSIS, ROUTINE W REFLEX MICROSCOPIC - Abnormal; Notable for the following:    Hgb urine dipstick SMALL (*)    Ketones, ur 15 (*)    Leukocytes, UA SMALL (*)    All other components within normal limits  PREGNANCY, URINE  LIPASE, BLOOD  URINE MICROSCOPIC-ADD ON  Rosezena Sensor, ED    Imaging Review Dg Chest 2 View  04/14/2014   CLINICAL DATA:  Midsternal chest pain with shortness of breath.  EXAM: CHEST  2 VIEW  COMPARISON:  01/31/2013 radiographs.  FINDINGS: The heart size and mediastinal contours are normal. The lungs are clear. There is no pleural effusion or pneumothorax. No acute osseous findings are identified. Telemetry leads overlie the chest.  IMPRESSION: No active cardiopulmonary process. There are persistent low lung volumes.   Electronically Signed   By: Roxy Horseman M.D.   On: 04/14/2014 15:03     EKG Interpretation   Date/Time:  Tuesday April 14 2014 13:33:40 EDT Ventricular Rate:  81 PR Interval:  173 QRS Duration: 80 QT Interval:  496 QTC Calculation: 576 R Axis:   30 Text Interpretation:  Sinus rhythm Consider left atrial enlargement  Abnormal R-wave progression, early transition Borderline T abnormalities,   anterior leads Prolonged QT interval Baseline wander in lead(s) V2 No  significant change was found Confirmed by CAMPOS  MD, KEVIN ([id number]) on  04/14/2014 2:06:47 PM      MDM   Final diagnoses:  Chest pain, unspecified chest pain type  Abdominal pain, unspecified abdominal location   2:16PM: Patient with midsternal pain ~ 1 hr that radiated to abdomen and has since resolved. ACS unlikely given history, unchanged EKG. Will check troponin. She has a history of gallstones and this may be biliary colic. There may be an anxiety component as she spent some time sharing how overwhelmed she is with work, school, and parenting duties. Also consider GERD, gastric ulcer, pancreatitis. Will check CXR, CBC, CMP, lipase, bHCG, UA. Will treat pain, nausea, and itching with dilaudid 1 mg, zofran 4 mg, and benadryl 25 mg, respectively.  She has a history of gallstones on CT. Will check   3:20 PM: ACS ruled out w unchanged EKG and negative troponin. Gallstones unlikely contributor as LFTs normal. Infection unlikely as afebrile and no leukocytosis. CXR normal. This is likely GERD. We will discharge with PPI and nausea medication and recommend close follow-up with PCP.  Lorenda HatchetAdam L Jynesis Nakamura, MD 04/14/14 1556

## 2014-04-14 NOTE — ED Notes (Signed)
Pt alert, arrives from home, presents via EMS, c/o mid sternal chest pain, onset was today, denies hx, describes pain as a cramp, + sob, denies emesis, + nausea. Resp even unlabored, skin pwd

## 2014-04-14 NOTE — ED Provider Notes (Signed)
I saw and evaluated the patient, reviewed the resident's note and I agree with the findings and plan.   EKG Interpretation   Date/Time:  Tuesday April 14 2014 13:33:40 EDT Ventricular Rate:  81 PR Interval:  173 QRS Duration: 80 QT Interval:  496 QTC Calculation: 576 R Axis:   30 Text Interpretation:  Sinus rhythm Consider left atrial enlargement  Abnormal R-wave progression, early transition Borderline T abnormalities,  anterior leads Prolonged QT interval Baseline wander in lead(s) V2 No  significant change was found Confirmed by Reece Mcbroom  MD, Lorielle Boehning ([id number]) on  04/14/2014 2:06:47 PM      Overall well-appearing.  His loose of chest pain.  Low suspicion for ACS.  EKG and troponin are normal.  She does have a history of gallstones based on CT scan in 2004.  No right upper quadrant epigastric discomfort at this time.  Lipase note these are normal.  Patient feels better.  Urine normal.  Discharge home in good condition.  PCP followup  Lyanne CoKevin M Juron Vorhees, MD 04/14/14 [phone]

## 2014-04-14 NOTE — ED Notes (Signed)
Bed: [id number] Expected date:  Expected time:  Means of arrival:  Comments: EMS- epigastric and abdominal pain

## 2014-06-22 ENCOUNTER — Emergency Department (HOSPITAL_COMMUNITY)
Admission: EM | Admit: 2014-06-22 | Discharge: 2014-06-23 | Disposition: A | Discharge status: Home / Self Care | Payer: Medicaid Other | Attending: Emergency Medicine | Admitting: Emergency Medicine

## 2014-06-22 ENCOUNTER — Encounter (HOSPITAL_COMMUNITY): Payer: Self-pay | Admitting: Emergency Medicine

## 2014-06-22 DIAGNOSIS — E785 Hyperlipidemia, unspecified: Secondary | ICD-10-CM | POA: Insufficient documentation

## 2014-06-22 DIAGNOSIS — T7840XA Allergy, unspecified, initial encounter: Secondary | ICD-10-CM | POA: Diagnosis present

## 2014-06-22 DIAGNOSIS — R12 Heartburn: Secondary | ICD-10-CM | POA: Diagnosis not present

## 2014-06-22 DIAGNOSIS — Y9289 Other specified places as the place of occurrence of the external cause: Secondary | ICD-10-CM | POA: Insufficient documentation

## 2014-06-22 DIAGNOSIS — Y9389 Activity, other specified: Secondary | ICD-10-CM | POA: Insufficient documentation

## 2014-06-22 DIAGNOSIS — X58XXXA Exposure to other specified factors, initial encounter: Secondary | ICD-10-CM | POA: Diagnosis not present

## 2014-06-22 DIAGNOSIS — Z79899 Other long term (current) drug therapy: Secondary | ICD-10-CM | POA: Diagnosis not present

## 2014-06-22 HISTORY — DX: Hyperlipidemia, unspecified: E78.5

## 2014-06-22 NOTE — ED Notes (Signed)
I was getting ready for bed but I took Macrobid 100mg  po. I thought I was taking my cholesterol med. Pt reported generalized itching and burning, swelling to lower lip, difficulty swallowing, tongue swollen and SHOB in the beginning but has gotten better.  Took Benadryl 50mg  at home PTA.

## 2014-06-23 MED ORDER — FAMOTIDINE IN NACL 20-0.9 MG/50ML-% IV SOLN
20.0000 mg | Freq: Once | INTRAVENOUS | Status: AC
  Administered 2014-06-23: 20 mg via INTRAVENOUS
  Filled 2014-06-23: qty 50

## 2014-06-23 MED ORDER — EPINEPHRINE 0.3 MG/0.3ML IJ SOAJ: INTRAMUSCULAR | Status: DC

## 2014-06-23 MED ORDER — DEXAMETHASONE SODIUM PHOSPHATE 10 MG/ML IJ SOLN
10.0000 mg | Freq: Once | INTRAMUSCULAR | Status: AC
  Administered 2014-06-23: 10 mg via INTRAVENOUS
  Filled 2014-06-23: qty 1

## 2014-06-23 MED ORDER — DIPHENHYDRAMINE HCL 50 MG/ML IJ SOLN
25.0000 mg | Freq: Once | INTRAMUSCULAR | Status: AC
  Administered 2014-06-23: 25 mg via INTRAVENOUS
  Filled 2014-06-23: qty 1

## 2014-06-23 NOTE — ED Notes (Signed)
After giving Decadron IV pt reported immediately reaction of generalized itching and burning.

## 2014-06-23 NOTE — ED Provider Notes (Signed)
CSN: [id number]     Arrival date & time 06/22/14  2339 History   First MD Initiated Contact with Patient 06/23/14 0104     Chief Complaint  Patient presents with  . Allergic Reaction     (Consider location/radiation/quality/duration/timing/severity/associated sxs/prior Treatment) HPI This 43 year old female with a history of hyperlipidemia. She thought she was taking her simvastatin at about 9:30 PM but accidentally took on Macrobid instead. She has taken Macrobid in the past without difficulty. Within about 30 minutes she developed severe generalized itching, heartburn, transient shortness of breath and swelling of her lips and tongue. She took 50 mg of Benadryl with partial improvement but she still complains of moderate generalized itching. Her heartburn and shortness of breath have resolved and the swelling of her lips and tongue have improved. She denies rash, nausea, vomiting or diarrhea.   Past Medical History  Diagnosis Date  . HLD (hyperlipidemia)    Past Surgical History  Procedure Laterality Date  . Cesarean section    . Tubal ligation    . Cesarean section    . Tubal ligation     Family History  Problem Relation Age of Onset  . Hypertension Mother   . Diabetes Mother   . Cancer Father     Stomach  . Heart disease Brother   . Alcohol abuse Paternal Aunt    History  Substance Use Topics  . Smoking status: Never Smoker   . Smokeless tobacco: Not on file  . Alcohol Use: No   OB History   Grav Para Term Preterm Abortions TAB SAB Ect Mult Living                 Review of Systems  All other systems reviewed and are negative.   Allergies  Ketorolac tromethamine; Minocycline; and Nitrofurantoin  Home Medications   Prior to Admission medications   Medication Sig Start Date End Date Taking? Authorizing Provider  albuterol (PROVENTIL HFA;VENTOLIN HFA) 108 (90 BASE) MCG/ACT inhaler Inhale 2 puffs into the lungs every 6 (six) hours as needed for wheezing or  shortness of breath.   Yes Historical Provider, MD  Multiple Vitamin (MULTIVITAMIN WITH MINERALS) TABS tablet Take 1 tablet by mouth daily.   Yes Historical Provider, MD  simvastatin (ZOCOR) 20 MG tablet Take 20 mg by mouth daily.   Yes Historical Provider, MD   BP 95/78  Pulse 96  Temp(Src) 98.7 F (37.1 C) (Oral)  Resp 18  SpO2 98%  LMP 05/28/2014  Physical Exam General: Well-developed, well-nourished female in no acute distress; appearance consistent with age of record HENT: normocephalic; atraumatic; slight edema of lips and tongue; no pharyngeal edema; no dysphonia Eyes: pupils equal, round and reactive to light; extraocular muscles intact Neck: supple Heart: regular rate and rhythm; no murmurs, rubs or gallopsilaterally Abdomen: soft; nondistended; nontender; nbowel sounds present Extremities: No deformity; full range of motion; pulses normal Neurologic: Awake, alert and oriented; motor function intact in all extremities and symmetric; no facial droop Skin: Warm and dry; no rash seen Psychiatric: Normal mood and affect    ED Course  Procedures (including critical care time)   MDM  2:08 AM Patient feeling much better after IV medications. No significant angioedema present. Patient wishes to go home. She has Benadryl at home which she will use as needed.    Hanley SeamenJohn L Karsin Pesta, MD 06/23/14 [phone]

## 2014-08-07 ENCOUNTER — Emergency Department (HOSPITAL_COMMUNITY)
Admission: EM | Admit: 2014-08-07 | Discharge: 2014-08-07 | Disposition: A | Discharge status: Home / Self Care | Payer: Medicaid Other | Attending: Emergency Medicine | Admitting: Emergency Medicine

## 2014-08-07 ENCOUNTER — Encounter (HOSPITAL_COMMUNITY): Payer: Self-pay | Admitting: Emergency Medicine

## 2014-08-07 DIAGNOSIS — M79642 Pain in left hand: Secondary | ICD-10-CM | POA: Insufficient documentation

## 2014-08-07 DIAGNOSIS — M79641 Pain in right hand: Secondary | ICD-10-CM | POA: Insufficient documentation

## 2014-08-07 DIAGNOSIS — Z8639 Personal history of other endocrine, nutritional and metabolic disease: Secondary | ICD-10-CM | POA: Insufficient documentation

## 2014-08-07 DIAGNOSIS — Z79899 Other long term (current) drug therapy: Secondary | ICD-10-CM | POA: Diagnosis not present

## 2014-08-07 DIAGNOSIS — R200 Anesthesia of skin: Secondary | ICD-10-CM | POA: Diagnosis present

## 2014-08-07 DIAGNOSIS — M79603 Pain in arm, unspecified: Secondary | ICD-10-CM

## 2014-08-07 MED ORDER — METHOCARBAMOL 500 MG PO TABS: 500.0000 mg | ORAL_TABLET | Freq: Two times a day (BID) | ORAL | Status: DC | PRN

## 2014-08-07 MED ORDER — HYDROCODONE-ACETAMINOPHEN 5-325 MG PO TABS: 1.0000 | ORAL_TABLET | ORAL | Status: DC | PRN

## 2014-08-07 MED ORDER — ACETAMINOPHEN 325 MG PO TABS
650.0000 mg | ORAL_TABLET | Freq: Once | ORAL | Status: AC
  Administered 2014-08-07: 650 mg via ORAL
  Filled 2014-08-07: qty 2

## 2014-08-07 NOTE — ED Provider Notes (Signed)
Medical screening examination/treatment/procedure(s) were conducted as a shared visit with non-physician practitioner(s) and myself.  I personally evaluated the patient during the encounter.   EKG Interpretation None     Patient here with bilateral upper extremity numbness 3 days. Patient notes increased use of her arms at work. Denies any neck pain. No focal neurological deficits. Neurological exam normal. Suspect overuse injury and give patient prescription for pain medication and muscle relaxant  Toy BakerAnthony T Ronald Vinsant, MD 08/07/14 1551

## 2014-08-07 NOTE — Discharge Instructions (Signed)
Shoulder Pain The shoulder is the joint that connects your arms to your body. The bones that form the shoulder joint include the upper arm bone (humerus), the shoulder blade (scapula), and the collarbone (clavicle). The top of the humerus is shaped like a ball and fits into a rather flat socket on the scapula (glenoid cavity). A combination of muscles and strong, fibrous tissues that connect muscles to bones (tendons) support your shoulder joint and hold the ball in the socket. Small, fluid-filled sacs (bursae) are located in different areas of the joint. They act as cushions between the bones and the overlying soft tissues and help reduce friction between the gliding tendons and the bone as you move your arm. Your shoulder joint allows a wide range of motion in your arm. This range of motion allows you to do things like scratch your back or throw a ball. However, this range of motion also makes your shoulder more prone to pain from overuse and injury. Causes of shoulder pain can originate from both injury and overuse and usually can be grouped in the following four categories:  Redness, swelling, and pain (inflammation) of the tendon (tendinitis) or the bursae (bursitis).  Instability, such as a dislocation of the joint.  Inflammation of the joint (arthritis).  Broken bone (fracture). HOME CARE INSTRUCTIONS   Apply ice to the sore area.  Put ice in a plastic bag.  Place a towel between your skin and the bag.  Leave the ice on for 15-20 minutes, 3-4 times per day for the first 2 days, or as directed by your health care provider.  Stop using cold packs if they do not help with the pain.  If you have a shoulder sling or immobilizer, wear it as long as your caregiver instructs. Only remove it to shower or bathe. Move your arm as little as possible, but keep your hand moving to prevent swelling.  Squeeze a soft ball or foam pad as much as possible to help prevent swelling.  Only take  over-the-counter or prescription medicines for pain, discomfort, or fever as directed by your caregiver. SEEK MEDICAL CARE IF:   Your shoulder pain increases, or new pain develops in your arm, hand, or fingers.  Your hand or fingers become cold and numb.  Your pain is not relieved with medicines. SEEK IMMEDIATE MEDICAL CARE IF:   Your arm, hand, or fingers are numb or tingling.  Your arm, hand, or fingers are significantly swollen or turn white or blue. MAKE SURE YOU:   Understand these instructions.  Will watch your condition.  Will get help right away if you are not doing well or get worse. Document Released: 05/24/2005 Document Revised: 12/29/2013 Document Reviewed: 07/29/2011 [facility] Patient Information 2015 [place], [place]. This information is not intended to replace advice given to you by your health care provider. Make sure you discuss any questions you have with your health care provider. Wrist Pain Wrist injuries are frequent in adults and children. A sprain is an injury to the ligaments that hold your bones together. A strain is an injury to muscle or muscle cord-like structures (tendons) from stretching or pulling. Generally, when wrists are moderately tender to touch following a fall or injury, a break in the bone (fracture) may be present. Most wrist sprains or strains are better in 3 to 5 days, but complete healing may take several weeks. HOME CARE INSTRUCTIONS   Put ice on the injured area.  Put ice in a plastic bag.  Place  a towel between your skin and the bag.  Leave the ice on for 15-20 minutes, 3-4 times a day, for the first 2 days, or as directed by your health care provider.  Keep your arm raised above the level of your heart whenever possible to reduce swelling and pain.  Rest the injured area for at least 48 hours or as directed by your health care provider.  If a splint or elastic bandage has been applied, use it for as long as directed by your health  care provider or until seen by a health care provider for a follow-up exam.  Only take over-the-counter or prescription medicines for pain, discomfort, or fever as directed by your health care provider.  Keep all follow-up appointments. You may need to follow up with a specialist or have follow-up X-rays. Improvement in pain level is not a guarantee that you did not fracture a bone in your wrist. The only way to determine whether or not you have a broken bone is by X-ray. SEEK IMMEDIATE MEDICAL CARE IF:   Your fingers are swollen, very red, white, or cold and blue.  Your fingers are numb or tingling.  You have increasing pain.  You have difficulty moving your fingers. MAKE SURE YOU:   Understand these instructions.  Will watch your condition.  Will get help right away if you are not doing well or get worse. Document Released: 05/24/2005 Document Revised: 08/19/2013 Document Reviewed: 10/05/2010 [facility] Patient Information 2015 [place], [place]. This information is not intended to replace advice given to you by your health care provider. Make sure you discuss any questions you have with your health care provider.

## 2014-08-07 NOTE — ED Provider Notes (Signed)
CSN: [id number]     Arrival date & time 08/07/14  1321 History   None    Chief Complaint  Patient presents with  . hand numbness   . Arm Pain   Sherri Walton is a 43 y.o. female with a history of hyperlipidemia who presents to the ED complaining of pain and numbness and tingling in her bilateral hands for the past 4 days. Patient reports that she uses her hands frequently at work has recently changed positions 2 weeks ago. 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 The patient has lots of wrist movement at work.  Patient is complaining of bilateral pain in her deltoids as well as numbness and tingling in the tips of her four digits that sometimes radiates up her arm. This is been intermittent for the past 4 days. Patient rates her pain as 8 out of 10 and describes it as nagging. Her pain is worse with movement. The pain in her deltoids is worse with palpation. The patient denies fevers, chills, recent illness, neck pain, back pain, loss of bowel or bladder control, saddle anesthesia, headaches, lightheadedness, changes to vision, dizziness, lower extremity numbness or tingling, weakness, abdominal pain, nausea, or vomiting.  (Consider location/radiation/quality/duration/timing/severity/associated sxs/prior Treatment) HPI  Past Medical History  Diagnosis Date  . HLD (hyperlipidemia)    Past Surgical History  Procedure Laterality Date  . Cesarean section    . Tubal ligation    . Cesarean section    . Tubal ligation     Family History  Problem Relation Age of Onset  . Hypertension Mother   . Diabetes Mother   . Cancer Father     Stomach  . Heart disease Brother   . Alcohol abuse Paternal Aunt    History  Substance Use Topics  . Smoking status: Never Smoker   . Smokeless tobacco: Not on file  . Alcohol Use: No   OB History    No data available     Review of Systems  Constitutional: Negative for fever and  chills.  HENT: Negative for congestion, ear pain, facial swelling, hearing loss, rhinorrhea, sore throat and trouble swallowing.   Eyes: Negative for pain and visual disturbance.  Respiratory: Negative for cough, shortness of breath and wheezing.   Cardiovascular: Negative for chest pain and palpitations.  Gastrointestinal: Negative for nausea, vomiting, abdominal pain and diarrhea.  Genitourinary: Negative for dysuria, hematuria, flank pain and difficulty urinating.  Musculoskeletal: Negative for back pain, joint swelling, neck pain and neck stiffness.       Bilateral hand pain and numbness.   Skin: Negative for rash.  Neurological: Positive for numbness. Negative for dizziness, speech difficulty, light-headedness and headaches.  All other systems reviewed and are negative.     Allergies  Minocycline; Nitrofurantoin; and Ketorolac tromethamine  Home Medications   Prior to Admission medications   Medication Sig Start Date End Date Taking? Authorizing Provider  acetaminophen (TYLENOL) 500 MG tablet Take 1,000 mg by mouth every 8 (eight) hours as needed for headache.   Yes Historical Provider, MD  albuterol (PROVENTIL HFA;VENTOLIN HFA) 108 (90 BASE) MCG/ACT inhaler Inhale 2 puffs into the lungs every 6 (six) hours as needed for wheezing or shortness of breath.   Yes Historical Provider, MD  EPINEPHrine 0.3 mg/0.3 mL IJ SOAJ injection Self inject per package instructions as needed for severe allergic reaction and seek medical attention. Patient taking differently: Inject 0.3 mg into the skin once. Self inject  per package instructions as needed for severe allergic reaction and seek medical attention. 06/23/14  Yes John L Molpus, MD  Multiple Vitamin (MULTIVITAMIN WITH MINERALS) TABS tablet Take 1 tablet by mouth daily.   Yes Historical Provider, MD  HYDROcodone-acetaminophen (NORCO/VICODIN) 5-325 MG per tablet Take 1 tablet by mouth every 4 (four) hours as needed for moderate pain or severe  pain. 08/07/14   Einar Gip Roldan Laforest, PA-C  methocarbamol (ROBAXIN) 500 MG tablet Take 1 tablet (500 mg total) by mouth 2 (two) times daily as needed for muscle spasms. 08/07/14   Einar Gip Jameison Haji, PA-C   BP 120/91 mmHg  Pulse 74  Temp(Src) 98.3 F (36.8 C) (Oral)  Resp 16  SpO2 100%  LMP 07/12/2014 Physical Exam  Constitutional: She is oriented to person, place, and time. She appears well-developed and well-nourished. No distress.  HENT:  Head: Normocephalic and atraumatic.  Mouth/Throat: Oropharynx is clear and moist. No oropharyngeal exudate.  Eyes: Conjunctivae are normal. Pupils are equal, round, and reactive to light. Right eye exhibits no discharge. Left eye exhibits no discharge.  Neck: Normal range of motion. Neck supple.  Cardiovascular: Normal rate, regular rhythm, normal heart sounds and intact distal pulses.  Exam reveals no gallop and no friction rub.   No murmur heard. Bilateral radial pulses are intact. Bilateral posterior tibialis pulses are intact.  Pulmonary/Chest: Effort normal and breath sounds normal. No respiratory distress. She has no wheezes. She has no rales.  Abdominal: Soft. She exhibits no distension. There is no tenderness.  Musculoskeletal: Normal range of motion. She exhibits no edema or tenderness.  The patient has normal range of motion of her bilateral upper and lower extremities. Patient has full range of motion of her wrists and fingers. Patient is full range of motion of her neck. There is no neck bony point tenderness. There is no back pain and point tenderness. Patient is able to ambulate without difficulty or assistance.  Lymphadenopathy:    She has no cervical adenopathy.  Neurological: She is alert and oriented to person, place, and time. She has normal reflexes. She displays normal reflexes. No cranial nerve deficit. Coordination normal.  Patient's strength is 5/5 in her bilateral upper and lower extremities. Patient has good sensation in  her bilateral fingertips and hands. Patient has equal and good grip strengths bilaterally. Cranial nerves II-XII intact bilaterally. Negative Phalen's test and Tinel's test.   Skin: Skin is warm and dry. No rash noted. She is not diaphoretic. No erythema. No pallor.  Psychiatric: She has a normal mood and affect. Her behavior is normal.  Nursing note and vitals reviewed.   ED Course  Procedures (including critical care time) Labs Review Labs Reviewed - No data to display  Imaging Review No results found.   EKG Interpretation None      Filed Vitals:   08/07/14 1331 08/07/14 1613  BP: 114/64 120/91  Pulse: 83 74  Temp: 98.3 F (36.8 C)   TempSrc: Oral   Resp: 18 16  SpO2: 98% 100%     MDM   Meds given in ED:  Medications  acetaminophen (TYLENOL) tablet 650 mg (650 mg Oral Given 08/07/14 1549)    Discharge Medication List as of 08/07/2014  4:06 PM    START taking these medications   Details  HYDROcodone-acetaminophen (NORCO/VICODIN) 5-325 MG per tablet Take 1 tablet by mouth every 4 (four) hours as needed for moderate pain or severe pain., Starting 08/07/2014, Until Discontinued, Print    methocarbamol (ROBAXIN) 500  MG tablet Take 1 tablet (500 mg total) by mouth 2 (two) times daily as needed for muscle spasms., Starting 08/07/2014, Until Discontinued, Print        Final diagnoses:  Pain of upper extremity, unspecified laterality  Bilateral hand pain   Sherri Walton is a 43 y.o. female with a history of hyperlipidemia who presents to the ED complaining of pain and numbness and tingling in her bilateral hands for the past 4 days. Patient recently changed positions at her job which required more heavy lifting and strenuous use of her arms and hands. Patient's symptoms seem to be related to these changes at work. Patient reports she is moving out of this position. Patient has good sensation in her bilateral upper extremities. There are no focal neurological  deficits. Patient has a normal neurological exam. Patient is afebrile and nontoxic appearing. We'll discharge the patient with Norco and Robaxin. Advised patient to follow-up with her primary care provider this week. Advised patient to return to the emergency department near worsening symptoms or new concerns. The patient verbalized understanding and agreement with plan.  This patient was discussed with and evaluated by Dr. Freida BusmanAllen who agrees with assessment and plan.      Lawana ChambersWilliam Duncan Lewi Drost, PA-C 08/08/14 [phone]

## 2014-08-07 NOTE — ED Notes (Signed)
Pt c/o bilat hand numbness/tingling that started Tuesday. Pt states that she has pain that radiates from right hand up right arm. Pt initally thought it was related to moving to a new postion at work to help them out but it hasnt stopped.

## 2014-08-31 ENCOUNTER — Emergency Department (HOSPITAL_COMMUNITY)
Admission: EM | Admit: 2014-08-31 | Discharge: 2014-08-31 | Disposition: A | Discharge status: Home / Self Care | Payer: Medicaid Other | Attending: Emergency Medicine | Admitting: Emergency Medicine

## 2014-08-31 ENCOUNTER — Encounter (HOSPITAL_COMMUNITY): Payer: Self-pay | Admitting: Emergency Medicine

## 2014-08-31 DIAGNOSIS — E860 Dehydration: Secondary | ICD-10-CM

## 2014-08-31 DIAGNOSIS — R197 Diarrhea, unspecified: Secondary | ICD-10-CM | POA: Insufficient documentation

## 2014-08-31 DIAGNOSIS — Z3202 Encounter for pregnancy test, result negative: Secondary | ICD-10-CM | POA: Insufficient documentation

## 2014-08-31 DIAGNOSIS — Z79899 Other long term (current) drug therapy: Secondary | ICD-10-CM | POA: Insufficient documentation

## 2014-08-31 DIAGNOSIS — Z8639 Personal history of other endocrine, nutritional and metabolic disease: Secondary | ICD-10-CM | POA: Insufficient documentation

## 2014-08-31 LAB — URINALYSIS, ROUTINE W REFLEX MICROSCOPIC
GLUCOSE, UA: NEGATIVE mg/dL
Ketones, ur: NEGATIVE mg/dL
Nitrite: NEGATIVE
PH: 6 (ref 5.0–8.0)
Protein, ur: NEGATIVE mg/dL
SPECIFIC GRAVITY, URINE: 1.033 — AB (ref 1.005–1.030)
Urobilinogen, UA: 1 mg/dL (ref 0.0–1.0)

## 2014-08-31 LAB — CBC WITH DIFFERENTIAL/PLATELET
Basophils Absolute: 0 10*3/uL (ref 0.0–0.1)
Basophils Relative: 0 % (ref 0–1)
EOS PCT: 3 % (ref 0–5)
Eosinophils Absolute: 0.1 10*3/uL (ref 0.0–0.7)
HCT: 40.5 % (ref 36.0–46.0)
Hemoglobin: 13.1 g/dL (ref 12.0–15.0)
LYMPHS ABS: 1.9 10*3/uL (ref 0.7–4.0)
LYMPHS PCT: 38 % (ref 12–46)
MCH: 30.7 pg (ref 26.0–34.0)
MCHC: 32.3 g/dL (ref 30.0–36.0)
MCV: 94.8 fL (ref 78.0–100.0)
MONO ABS: 0.6 10*3/uL (ref 0.1–1.0)
MONOS PCT: 12 % (ref 3–12)
Neutro Abs: 2.3 10*3/uL (ref 1.7–7.7)
Neutrophils Relative %: 47 % (ref 43–77)
Platelets: 286 10*3/uL (ref 150–400)
RBC: 4.27 MIL/uL (ref 3.87–5.11)
RDW: 13 % (ref 11.5–15.5)
WBC: 4.9 10*3/uL (ref 4.0–10.5)

## 2014-08-31 LAB — COMPREHENSIVE METABOLIC PANEL
ALT: 31 U/L (ref 0–35)
AST: 27 U/L (ref 0–37)
Albumin: 4.2 g/dL (ref 3.5–5.2)
Alkaline Phosphatase: 58 U/L (ref 39–117)
Anion gap: 5 (ref 5–15)
BILIRUBIN TOTAL: 1.1 mg/dL (ref 0.3–1.2)
BUN: 9 mg/dL (ref 6–23)
CO2: 27 mmol/L (ref 19–32)
CREATININE: 0.76 mg/dL (ref 0.50–1.10)
Calcium: 9.4 mg/dL (ref 8.4–10.5)
Chloride: 108 mEq/L (ref 96–112)
Glucose, Bld: 77 mg/dL (ref 70–99)
Potassium: 3.4 mmol/L — ABNORMAL LOW (ref 3.5–5.1)
Sodium: 140 mmol/L (ref 135–145)
Total Protein: 7.9 g/dL (ref 6.0–8.3)

## 2014-08-31 LAB — URINE MICROSCOPIC-ADD ON

## 2014-08-31 LAB — LIPASE, BLOOD: Lipase: 25 U/L (ref 11–59)

## 2014-08-31 LAB — POC URINE PREG, ED: Preg Test, Ur: NEGATIVE

## 2014-08-31 MED ORDER — HYOSCYAMINE SULFATE 0.125 MG SL SUBL: 0.1250 mg | SUBLINGUAL_TABLET | SUBLINGUAL | Status: DC | PRN

## 2014-08-31 NOTE — Discharge Instructions (Signed)
You are slightly dehydrated today.  Drink plenty of fluids.  Get rechecked if you develop fevers, abdominal pain, or new concerning symptoms.     Dehydration, Adult Dehydration is when you lose more fluids from the body than you take in. Vital organs like the kidneys, brain, and heart cannot function without a proper amount of fluids and salt. Any loss of fluids from the body can cause dehydration.  CAUSES   Vomiting.  Diarrhea.  Excessive sweating.  Excessive urine output.  Fever. SYMPTOMS  Mild dehydration  Thirst.  Dry lips.  Slightly dry mouth. Moderate dehydration  Very dry mouth.  Sunken eyes.  Skin does not bounce back quickly when lightly pinched and released.  Dark urine and decreased urine production.  Decreased tear production.  Headache. Severe dehydration  Very dry mouth.  Extreme thirst.  Rapid, weak pulse (more than 100 beats per minute at rest).  Cold hands and feet.  Not able to sweat in spite of heat and temperature.  Rapid breathing.  Blue lips.  Confusion and lethargy.  Difficulty being awakened.  Minimal urine production.  No tears. DIAGNOSIS  Your caregiver will diagnose dehydration based on your symptoms and your exam. Blood and urine tests will help confirm the diagnosis. The diagnostic evaluation should also identify the cause of dehydration. TREATMENT  Treatment of mild or moderate dehydration can often be done at home by increasing the amount of fluids that you drink. It is best to drink small amounts of fluid more often. Drinking too much at one time can make vomiting worse. Refer to the home care instructions below. Severe dehydration needs to be treated at the hospital where you will probably be given intravenous (IV) fluids that contain water and electrolytes. HOME CARE INSTRUCTIONS   Ask your caregiver about specific rehydration instructions.  Drink enough fluids to keep your urine clear or pale yellow.  Drink  small amounts frequently if you have nausea and vomiting.  Eat as you normally do.  Avoid:  Foods or drinks high in sugar.  Carbonated drinks.  Juice.  Extremely hot or cold fluids.  Drinks with caffeine.  Fatty, greasy foods.  Alcohol.  Tobacco.  Overeating.  Gelatin desserts.  Wash your hands well to avoid spreading bacteria and viruses.  Only take over-the-counter or prescription medicines for pain, discomfort, or fever as directed by your caregiver.  Ask your caregiver if you should continue all prescribed and over-the-counter medicines.  Keep all follow-up appointments with your caregiver. SEEK MEDICAL CARE IF:  You have abdominal pain and it increases or stays in one area (localizes).  You have a rash, stiff neck, or severe headache.  You are irritable, sleepy, or difficult to awaken.  You are weak, dizzy, or extremely thirsty. SEEK IMMEDIATE MEDICAL CARE IF:   You are unable to keep fluids down or you get worse despite treatment.  You have frequent episodes of vomiting or diarrhea.  You have blood or green matter (bile) in your vomit.  You have blood in your stool or your stool looks black and tarry.  You have not urinated in 6 to 8 hours, or you have only urinated a small amount of very dark urine.  You have a fever.  You faint. MAKE SURE YOU:   Understand these instructions.  Will watch your condition.  Will get help right away if you are not doing well or get worse. Document Released: 08/14/2005 Document Revised: 11/06/2011 Document Reviewed: 04/03/2011 [facility] Patient Information 2015 [place], [place]. This  information is not intended to replace advice given to you by your health care provider. Make sure you discuss any questions you have with your health care provider. ° °

## 2014-08-31 NOTE — ED Provider Notes (Signed)
CSN: [id number]     Arrival date & time 08/31/14  1711 History   None    No chief complaint on file.    The history is provided by the patient. No language interpreter was used.   Ms. Bradwell awoke three days ago with diffuse body aches.  Chilling/sweating spells, stomach cramping (LUQ).  Had large amount of diarrhea starting three days ago.  Diarrhea is improved today. No fever today.  No vomiting, has nausea.  Has dysuria and dark urine.  Sxs are moderate, intermittent, improving.  Has no medical problems, no abdominal surgeries.  Her daughter is sick with similar sxs.    Past Medical History  Diagnosis Date  . HLD (hyperlipidemia)    Past Surgical History  Procedure Laterality Date  . Cesarean section    . Tubal ligation    . Cesarean section    . Tubal ligation     Family History  Problem Relation Age of Onset  . Hypertension Mother   . Diabetes Mother   . Cancer Father     Stomach  . Heart disease Brother   . Alcohol abuse Paternal Aunt    History  Substance Use Topics  . Smoking status: Never Smoker   . Smokeless tobacco: Not on file  . Alcohol Use: No   OB History    No data available     Review of Systems  All other systems reviewed and are negative.     Allergies  Minocycline; Nitrofurantoin; and Ketorolac tromethamine  Home Medications   Prior to Admission medications   Medication Sig Start Date End Date Taking? Authorizing Provider  acetaminophen (TYLENOL) 500 MG tablet Take 1,000 mg by mouth every 8 (eight) hours as needed for headache.    Historical Provider, MD  albuterol (PROVENTIL HFA;VENTOLIN HFA) 108 (90 BASE) MCG/ACT inhaler Inhale 2 puffs into the lungs every 6 (six) hours as needed for wheezing or shortness of breath.    Historical Provider, MD  EPINEPHrine 0.3 mg/0.3 mL IJ SOAJ injection Self inject per package instructions as needed for severe allergic reaction and seek medical attention. Patient taking differently: Inject 0.3 mg into the  skin once. Self inject per package instructions as needed for severe allergic reaction and seek medical attention. 06/23/14   Carlisle Beers Molpus, MD  HYDROcodone-acetaminophen (NORCO/VICODIN) 5-325 MG per tablet Take 1 tablet by mouth every 4 (four) hours as needed for moderate pain or severe pain. 08/07/14   Einar Gip Dansie, PA-C  methocarbamol (ROBAXIN) 500 MG tablet Take 1 tablet (500 mg total) by mouth 2 (two) times daily as needed for muscle spasms. 08/07/14   Einar Gip Dansie, PA-C  Multiple Vitamin (MULTIVITAMIN WITH MINERALS) TABS tablet Take 1 tablet by mouth daily.    Historical Provider, MD   BP 114/67 mmHg  Pulse 78  Temp(Src) 98.7 F (37.1 C) (Oral)  Resp 18  SpO2 98%  LMP 08/11/2014 Physical Exam  Constitutional: She is oriented to person, place, and time. She appears well-developed and well-nourished.  HENT:  Head: Normocephalic and atraumatic.  Cardiovascular: Normal rate and regular rhythm.   No murmur heard. Pulmonary/Chest: Effort normal and breath sounds normal. No respiratory distress.  Abdominal: Soft. There is no tenderness. There is no rebound and no guarding.  Musculoskeletal: She exhibits no edema or tenderness.  Neurological: She is alert and oriented to person, place, and time.  Skin: Skin is warm and dry.  Psychiatric: She has a normal mood and affect. Her behavior is normal.  Nursing note and vitals reviewed.   ED Course  Procedures (including critical care time) Labs Review Labs Reviewed  COMPREHENSIVE METABOLIC PANEL - Abnormal; Notable for the following:    Potassium 3.4 (*)    All other components within normal limits  URINALYSIS, ROUTINE W REFLEX MICROSCOPIC - Abnormal; Notable for the following:    Color, Urine AMBER (*)    APPearance CLOUDY (*)    Specific Gravity, Urine 1.033 (*)    Hgb urine dipstick MODERATE (*)    Bilirubin Urine SMALL (*)    Leukocytes, UA SMALL (*)    All other components within normal limits  URINE  MICROSCOPIC-ADD ON - Abnormal; Notable for the following:    Squamous Epithelial / LPF MANY (*)    Bacteria, UA FEW (*)    All other components within normal limits  CBC WITH DIFFERENTIAL  LIPASE, BLOOD  POC URINE PREG, ED    Imaging Review No results found.   EKG Interpretation None      MDM   Final diagnoses:  Diarrhea  Dehydration    Patient here for evaluation of body aches, abdominal pain, diarrhea. Overall symptoms have improved. Patient with no abdominal tenderness on exam. UA is consistent with mild dehydration there is no evidence of acute urinary tract infection. Discussed with patient home care with oral fluid hydration as well as return precautions. Exam and hx not consistent with acute appendicitis, acute cholecystitis, serious bacterial infection.    Tilden Fossa, MD 09/01/14 [phone]

## 2014-08-31 NOTE — ED Notes (Signed)
Pt c/o body aches, diarrhea, nausea, onset Saturday 08-29-13, epigastric pain, and back pain.

## 2014-09-11 ENCOUNTER — Other Ambulatory Visit (HOSPITAL_COMMUNITY): Payer: Self-pay | Admitting: Specialist

## 2014-09-11 DIAGNOSIS — Z1231 Encounter for screening mammogram for malignant neoplasm of breast: Secondary | ICD-10-CM

## 2014-10-13 ENCOUNTER — Ambulatory Visit (HOSPITAL_COMMUNITY)
Admission: RE | Admit: 2014-10-13 | Discharge: 2014-10-13 | Disposition: A | Discharge status: Home / Self Care | Payer: BLUE CROSS/BLUE SHIELD | Source: Ambulatory Visit | Attending: Specialist | Admitting: Specialist

## 2014-10-13 ENCOUNTER — Other Ambulatory Visit (HOSPITAL_COMMUNITY): Payer: Self-pay | Admitting: Specialist

## 2014-10-13 DIAGNOSIS — Z1231 Encounter for screening mammogram for malignant neoplasm of breast: Secondary | ICD-10-CM

## 2014-11-06 ENCOUNTER — Emergency Department (HOSPITAL_COMMUNITY)
Admission: EM | Admit: 2014-11-06 | Discharge: 2014-11-06 | Discharge status: Against Medical Advice | Payer: BLUE CROSS/BLUE SHIELD | Attending: Emergency Medicine | Admitting: Emergency Medicine

## 2014-11-06 ENCOUNTER — Encounter (HOSPITAL_COMMUNITY): Payer: Self-pay | Admitting: Emergency Medicine

## 2014-11-06 DIAGNOSIS — N898 Other specified noninflammatory disorders of vagina: Secondary | ICD-10-CM | POA: Diagnosis not present

## 2014-11-06 DIAGNOSIS — R109 Unspecified abdominal pain: Secondary | ICD-10-CM | POA: Insufficient documentation

## 2014-11-06 NOTE — ED Notes (Signed)
Pt called for room with no response.  °

## 2014-11-06 NOTE — ED Notes (Signed)
No answer x1 in [facility]

## 2014-11-06 NOTE — ED Notes (Signed)
Pt states last week she held her bladder and shortly after that sxs started. Sxs of odor, abd discomfort and urine is cloudy.

## 2014-12-07 ENCOUNTER — Emergency Department (HOSPITAL_COMMUNITY)
Admission: EM | Admit: 2014-12-07 | Discharge: 2014-12-07 | Disposition: A | Discharge status: Home / Self Care | Payer: BLUE CROSS/BLUE SHIELD | Attending: Emergency Medicine | Admitting: Emergency Medicine

## 2014-12-07 ENCOUNTER — Encounter (HOSPITAL_COMMUNITY): Payer: Self-pay | Admitting: *Deleted

## 2014-12-07 DIAGNOSIS — Z3202 Encounter for pregnancy test, result negative: Secondary | ICD-10-CM | POA: Diagnosis not present

## 2014-12-07 DIAGNOSIS — Z8639 Personal history of other endocrine, nutritional and metabolic disease: Secondary | ICD-10-CM | POA: Insufficient documentation

## 2014-12-07 DIAGNOSIS — R319 Hematuria, unspecified: Secondary | ICD-10-CM | POA: Diagnosis not present

## 2014-12-07 DIAGNOSIS — R109 Unspecified abdominal pain: Secondary | ICD-10-CM

## 2014-12-07 DIAGNOSIS — R300 Dysuria: Secondary | ICD-10-CM | POA: Diagnosis not present

## 2014-12-07 DIAGNOSIS — Z9851 Tubal ligation status: Secondary | ICD-10-CM | POA: Diagnosis not present

## 2014-12-07 DIAGNOSIS — Z79899 Other long term (current) drug therapy: Secondary | ICD-10-CM | POA: Insufficient documentation

## 2014-12-07 LAB — URINALYSIS, ROUTINE W REFLEX MICROSCOPIC
Bilirubin Urine: NEGATIVE
Glucose, UA: NEGATIVE mg/dL
Ketones, ur: NEGATIVE mg/dL
Nitrite: NEGATIVE
Protein, ur: NEGATIVE mg/dL
Specific Gravity, Urine: 1.019 (ref 1.005–1.030)
Urobilinogen, UA: 0.2 mg/dL (ref 0.0–1.0)
pH: 6 (ref 5.0–8.0)

## 2014-12-07 LAB — URINE MICROSCOPIC-ADD ON

## 2014-12-07 LAB — POC URINE PREG, ED: Preg Test, Ur: NEGATIVE

## 2014-12-07 MED ORDER — ACETAMINOPHEN 325 MG PO TABS
650.0000 mg | ORAL_TABLET | Freq: Once | ORAL | Status: AC
  Administered 2014-12-07: 650 mg via ORAL
  Filled 2014-12-07: qty 2

## 2014-12-07 MED ORDER — TRAMADOL-ACETAMINOPHEN 37.5-325 MG PO TABS: 1.0000 | ORAL_TABLET | Freq: Four times a day (QID) | ORAL | Status: DC | PRN

## 2014-12-07 MED ORDER — CEPHALEXIN 500 MG PO CAPS: 500.0000 mg | ORAL_CAPSULE | Freq: Three times a day (TID) | ORAL | Status: DC

## 2014-12-07 MED ORDER — TRAMADOL HCL 50 MG PO TABS
50.0000 mg | ORAL_TABLET | Freq: Once | ORAL | Status: AC
  Administered 2014-12-07: 50 mg via ORAL
  Filled 2014-12-07: qty 1

## 2014-12-07 NOTE — Discharge Instructions (Signed)
Take the prescribed medication as directed. Follow-up with urology-- see referral and call to schedule appt. Return to the ED for new or worsening symptoms.

## 2014-12-07 NOTE — ED Notes (Signed)
Pt reports dysuria and foul smelling urine since this morning, back pain present as well. Pain 8/10.

## 2014-12-07 NOTE — ED Provider Notes (Signed)
CSN: [id number]     Arrival date & time 12/07/14  1727 History   First MD Initiated Contact with Patient 12/07/14 1741     Chief Complaint  Patient presents with  . Dysuria  . Back Pain     (Consider location/radiation/quality/duration/timing/severity/associated sxs/prior Treatment) Patient is a 44 y.o. female presenting with dysuria and back pain. The history is provided by the patient and medical records.  Dysuria Back Pain Associated symptoms: dysuria    This is a 44 y.o. F with PMH significant for hyperlipidemia, presenting to the ED for dysuria and foul smelling urine, onset this morning.  She denies fever, chills, sweats, or abdominal pain.  States she does have some back pain as well.  No reported injury, trauma, or fall.  No numbness, paresthesias or weakness of extremities.  No loss of bowel or bladder control.  No history of kidney stones.  She does have history of frequent UTIs, she has been evaluated by urology.  VSS on arrival.  Past Medical History  Diagnosis Date  . HLD (hyperlipidemia)    Past Surgical History  Procedure Laterality Date  . Cesarean section    . Tubal ligation    . Cesarean section    . Tubal ligation     Family History  Problem Relation Age of Onset  . Hypertension Mother   . Diabetes Mother   . Cancer Father     Stomach  . Heart disease Brother   . Alcohol abuse Paternal Aunt    History  Substance Use Topics  . Smoking status: Never Smoker   . Smokeless tobacco: Not on file  . Alcohol Use: No   OB History    No data available     Review of Systems  Genitourinary: Positive for dysuria.  Musculoskeletal: Positive for back pain.  All other systems reviewed and are negative.     Allergies  Minocycline; Nitrofurantoin; and Ketorolac tromethamine  Home Medications   Prior to Admission medications   Medication Sig Start Date End Date Taking? Authorizing Provider  acetaminophen (TYLENOL) 500 MG tablet Take 1,000 mg by mouth  every 8 (eight) hours as needed for headache.    Historical Provider, MD  albuterol (PROVENTIL HFA;VENTOLIN HFA) 108 (90 BASE) MCG/ACT inhaler Inhale 2 puffs into the lungs every 6 (six) hours as needed for wheezing or shortness of breath.    Historical Provider, MD  EPINEPHrine 0.3 mg/0.3 mL IJ SOAJ injection Self inject per package instructions as needed for severe allergic reaction and seek medical attention. Patient taking differently: Inject 0.3 mg into the skin once. Self inject per package instructions as needed for severe allergic reaction and seek medical attention. 06/23/14   John Molpus, MD  HYDROcodone-acetaminophen (NORCO/VICODIN) 5-325 MG per tablet Take 1 tablet by mouth every 4 (four) hours as needed for moderate pain or severe pain. 08/07/14   Everlene FarrierWilliam Dansie, PA-C  hyoscyamine (LEVSIN/SL) 0.125 MG SL tablet Place 1 tablet (0.125 mg total) under the tongue every 4 (four) hours as needed. 08/31/14   Tilden FossaElizabeth Rees, MD  methocarbamol (ROBAXIN) 500 MG tablet Take 1 tablet (500 mg total) by mouth 2 (two) times daily as needed for muscle spasms. 08/07/14   Everlene FarrierWilliam Dansie, PA-C  Multiple Vitamin (MULTIVITAMIN WITH MINERALS) TABS tablet Take 1 tablet by mouth daily.    Historical Provider, MD   BP 120/69 mmHg  Pulse 79  Temp(Src) 98.1 F (36.7 C) (Oral)  Resp 16  SpO2 100%   Physical Exam  Constitutional: She  is oriented to person, place, and time. She appears well-developed and well-nourished.  HENT:  Head: Normocephalic and atraumatic.  Mouth/Throat: Oropharynx is clear and moist.  Eyes: Conjunctivae and EOM are normal. Pupils are equal, round, and reactive to light.  Neck: Normal range of motion.  Cardiovascular: Normal rate, regular rhythm and normal heart sounds.   Pulmonary/Chest: Effort normal and breath sounds normal.  Abdominal: Soft. Bowel sounds are normal. There is no tenderness. There is no rigidity and no guarding.  Endorses pain of bilateral flanks but no focal  tenderness  Musculoskeletal: Normal range of motion.       Lumbar back: Normal.  Neurological: She is alert and oriented to person, place, and time.  Skin: Skin is warm and dry.  Psychiatric: She has a normal mood and affect.  Nursing note and vitals reviewed.   ED Course  Procedures (including critical care time) Labs Review Labs Reviewed  URINALYSIS, ROUTINE W REFLEX MICROSCOPIC - Abnormal; Notable for the following:    APPearance CLOUDY (*)    Hgb urine dipstick MODERATE (*)    Leukocytes, UA MODERATE (*)    All other components within normal limits  URINE MICROSCOPIC-ADD ON - Abnormal; Notable for the following:    Squamous Epithelial / LPF FEW (*)    Bacteria, UA MANY (*)    All other components within normal limits  POC URINE PREG, ED    Imaging Review No results found.   EKG Interpretation None      MDM   Final diagnoses:  Dysuria  Flank pain   44 year old female with dysuria and flank pain since this morning. On exam, patient afebrile and nontoxic in appearance. She endorses pain of bilateral flanks but there is no focal tenderness.  Lumbar spine exam within normal limits. There is no focal neurologic deficit or red flag symptoms. UA appears infectious.  Some blood noted, however doubt acute or obstructive kidney stones.  No fever to suggest acute pyelonephritis.  Will start on abx and pain meds.  Will refer to urology in [place] as patient will be moving there in 2 weeks.  Discussed plan with patient, he/she acknowledged understanding and agreed with plan of care.  Return precautions given for new or worsening symptoms.  Garlon Hatchet, PA-C 12/07/14 1949  Doug Sou, MD 12/07/14 [phone]

## 2014-12-07 NOTE — ED Notes (Signed)
Pt reports dysuria with back pain today.  No other complaints at this time.

## 2015-08-02 ENCOUNTER — Ambulatory Visit (INDEPENDENT_AMBULATORY_CARE_PROVIDER_SITE_OTHER): Payer: BLUE CROSS/BLUE SHIELD | Admitting: Podiatry

## 2015-08-02 ENCOUNTER — Ambulatory Visit: Payer: Self-pay

## 2015-08-02 VITALS — BP 111/69 | HR 97 | Resp 16 | Ht 68.0 in | Wt 197.0 lb

## 2015-08-02 DIAGNOSIS — M79673 Pain in unspecified foot: Secondary | ICD-10-CM

## 2015-08-02 DIAGNOSIS — Q828 Other specified congenital malformations of skin: Secondary | ICD-10-CM | POA: Diagnosis not present

## 2015-08-02 NOTE — Progress Notes (Signed)
   Subjective:    Patient ID: Sherri Walton, female    DOB: May 13, 1971, 44 y.o.   MRN: [id number]  HPI Patient presents with bilateral callouses; ball of feet & great toes.   Review of Systems  All other systems reviewed and are negative.      Objective:   Physical Exam        Assessment & Plan:

## 2015-08-03 NOTE — Progress Notes (Signed)
Subjective:     Patient ID: Sherri Walton, female   DOB: [birth year], 44 y.o.   MRN: [id number]  HPI patient presents with painful lesions on the bottom of both feet which become very irritating over the last 6 months   Review of Systems  All other systems reviewed and are negative.      Objective:   Physical Exam  Constitutional: She is oriented to person, place, and time.  Cardiovascular: Intact distal pulses.   Musculoskeletal: Normal range of motion.  Neurological: She is oriented to person, place, and time.  Skin: Skin is warm.  Nursing note and vitals reviewed.  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 Reappoint when symptomatic again

## 2015-09-20 ENCOUNTER — Other Ambulatory Visit: Payer: Self-pay

## 2015-09-20 DIAGNOSIS — Z1231 Encounter for screening mammogram for malignant neoplasm of breast: Secondary | ICD-10-CM

## 2015-10-18 ENCOUNTER — Other Ambulatory Visit: Payer: Self-pay

## 2015-10-18 ENCOUNTER — Ambulatory Visit
Admission: RE | Admit: 2015-10-18 | Discharge: 2015-10-18 | Disposition: A | Discharge status: Home / Self Care | Payer: BLUE CROSS/BLUE SHIELD | Source: Ambulatory Visit

## 2015-10-18 DIAGNOSIS — Z1231 Encounter for screening mammogram for malignant neoplasm of breast: Secondary | ICD-10-CM

## 2015-12-27 ENCOUNTER — Ambulatory Visit: Payer: BLUE CROSS/BLUE SHIELD | Admitting: Podiatry

## 2016-01-31 ENCOUNTER — Ambulatory Visit: Payer: BLUE CROSS/BLUE SHIELD | Admitting: Podiatry

## 2016-03-15 ENCOUNTER — Ambulatory Visit: Payer: BLUE CROSS/BLUE SHIELD | Admitting: Podiatry

## 2016-03-27 ENCOUNTER — Ambulatory Visit (INDEPENDENT_AMBULATORY_CARE_PROVIDER_SITE_OTHER): Payer: BLUE CROSS/BLUE SHIELD | Admitting: Podiatry

## 2016-03-27 ENCOUNTER — Encounter: Payer: Self-pay | Admitting: Podiatry

## 2016-03-27 DIAGNOSIS — Q828 Other specified congenital malformations of skin: Secondary | ICD-10-CM

## 2016-03-28 NOTE — Progress Notes (Signed)
Subjective:     Patient ID: Sherri Walton, female   DOB: 01-12-1971, 45 y.o.   MRN: [id number]  HPI patient presents with chronic callus formation plantar aspect both feet   Review of Systems     Objective:   Physical Exam Neurovascular status intact muscle strength adequate with severe keratotic lesion bilateral plantar feet    Assessment:     Chronic lesion formation bilateral    Plan:     Debris lesions bilateral with no iatrogenic bleeding noted

## 2016-08-02 ENCOUNTER — Ambulatory Visit (INDEPENDENT_AMBULATORY_CARE_PROVIDER_SITE_OTHER): Payer: Medicaid Other | Admitting: Podiatry

## 2016-08-02 ENCOUNTER — Encounter: Payer: Self-pay | Admitting: Podiatry

## 2016-08-02 DIAGNOSIS — M216X9 Other acquired deformities of unspecified foot: Secondary | ICD-10-CM

## 2016-08-02 DIAGNOSIS — M79671 Pain in right foot: Secondary | ICD-10-CM

## 2016-08-02 DIAGNOSIS — Q828 Other specified congenital malformations of skin: Secondary | ICD-10-CM

## 2016-08-02 DIAGNOSIS — M79672 Pain in left foot: Principal | ICD-10-CM

## 2016-08-03 NOTE — Progress Notes (Signed)
Subjective:     Patient ID: Sherri Walton, female   DOB: 11-23-1970, 45 y.o.   MRN: [id number]  HPI patient presents with pain in both feet with lesion formation and inability to walk comfortably   Review of Systems     Objective:   Physical Exam Neurovascular status intact muscle strength adequate range of motion within normal limits with patient's feet doing pretty well with mild edema noted of the localized nature    Assessment:     Keratotic lesion was structural changes bilateral localized in nature with no systemic issues    Plan:     Reviewed condition at great length discussed treatment options and at this point debridement accomplished again. May require more extensive procedure in the future

## 2016-09-28 ENCOUNTER — Emergency Department (HOSPITAL_COMMUNITY)
Admission: EM | Admit: 2016-09-28 | Discharge: 2016-09-28 | Disposition: A | Discharge status: Home / Self Care | Payer: No Typology Code available for payment source | Attending: Emergency Medicine | Admitting: Emergency Medicine

## 2016-09-28 ENCOUNTER — Encounter (HOSPITAL_COMMUNITY): Payer: Self-pay | Admitting: Emergency Medicine

## 2016-09-28 DIAGNOSIS — J069 Acute upper respiratory infection, unspecified: Secondary | ICD-10-CM | POA: Insufficient documentation

## 2016-09-28 DIAGNOSIS — R05 Cough: Secondary | ICD-10-CM | POA: Diagnosis present

## 2016-09-28 MED ORDER — ACETAMINOPHEN 325 MG PO TABS
650.0000 mg | ORAL_TABLET | Freq: Once | ORAL | Status: AC
  Administered 2016-09-28: 650 mg via ORAL
  Filled 2016-09-28: qty 2

## 2016-09-28 MED ORDER — ALBUTEROL SULFATE HFA 108 (90 BASE) MCG/ACT IN AERS: 1.0000 | INHALATION_SPRAY | Freq: Four times a day (QID) | RESPIRATORY_TRACT | 0 refills | Status: DC | PRN

## 2016-09-28 MED ORDER — AEROCHAMBER Z-STAT PLUS/MEDIUM MISC
1.0000 | Freq: Once | Status: AC
  Administered 2016-09-28: 1

## 2016-09-28 MED ORDER — BENZONATATE 100 MG PO CAPS: 100.0000 mg | ORAL_CAPSULE | Freq: Three times a day (TID) | ORAL | 0 refills | Status: DC

## 2016-09-28 NOTE — ED Provider Notes (Signed)
[facility]-EMERGENCY DEPT Provider Note   CSN: [id number] Arrival date & time: 09/28/16  3086  By signing my name below, I, Sonum Patel, attest that this documentation has been prepared under the direction and in the presence of [employer], PA-C. Electronically Signed: Sonum Patel, Neurosurgeon. 09/28/16. 10:21 AM.  History   Chief Complaint Chief Complaint  Patient presents with  . Cough  . Nasal Congestion   The history is provided by the patient. No language interpreter was used.    HPI Comments: Sherri Walton is a 46 y.o. female who presents to the Emergency Department complaining of a persistent cough productive of sputum that has been ongoing for 4 days. She reports associated sneezing, rhinorrhea, teary eyes, subjective fever, chills, and upper abdominal pain that is worse with coughing spells. She tried a leftover inhaler with mild relief. She reports having a sore throat which resolved with OTC medication. She denies sick contacts. She denies nausea, vomiting, generalized myalgia.   Past Medical History:  Diagnosis Date  . HLD (hyperlipidemia)     Patient Active Problem List   Diagnosis Date Noted  . Hepatitis B immune 08/26/2012  . DERMATOGRAPHIA 04/18/2010    Past Surgical History:  Procedure Laterality Date  . CESAREAN SECTION    . CESAREAN SECTION    . TUBAL LIGATION    . TUBAL LIGATION      OB History    No data available       Home Medications    Prior to Admission medications   Medication Sig Start Date End Date Taking? Authorizing Provider  EPINEPHrine 0.3 mg/0.3 mL IJ SOAJ injection Self inject per package instructions as needed for severe allergic reaction and seek medical attention. Patient taking differently: Inject 0.3 mg into the skin once. Self inject per package instructions as needed for severe allergic reaction and seek medical attention. 06/23/14   John Molpus, MD  HYDROcodone-acetaminophen (NORCO/VICODIN) 5-325 MG per tablet Take 1 tablet by  mouth every 4 (four) hours as needed for moderate pain or severe pain. 08/07/14   Everlene Farrier, PA-C  hyoscyamine (LEVSIN/SL) 0.125 MG SL tablet Place 1 tablet (0.125 mg total) under the tongue every 4 (four) hours as needed. 08/31/14   Tilden Fossa, MD  Multiple Vitamin (MULTIVITAMIN WITH MINERALS) TABS tablet Take 1 tablet by mouth daily.    Historical Provider, MD  traMADol-acetaminophen (ULTRACET) 37.5-325 MG per tablet Take 1 tablet by mouth every 6 (six) hours as needed. 12/07/14   Garlon Hatchet, PA-C    Family History Family History  Problem Relation Age of Onset  . Hypertension Mother   . Diabetes Mother   . Cancer Father     Stomach  . Heart disease Brother   . Alcohol abuse Paternal Aunt     Social History Social History  Substance Use Topics  . Smoking status: Never Smoker  . Smokeless tobacco: Not on file  . Alcohol use No     Allergies   Minocycline; Nitrofurantoin; and Ketorolac tromethamine   Review of Systems Review of Systems  Constitutional: Positive for chills and fever (subjective).  HENT: Positive for rhinorrhea, sneezing and sore throat.   Respiratory: Positive for cough.   Gastrointestinal: Positive for abdominal pain and diarrhea. Negative for nausea and vomiting.     Physical Exam Updated Vital Signs BP 130/74 (BP Location: Left Arm)   Pulse 75   Temp 98.3 F (36.8 C) (Oral)   Resp 14   Ht 5\' 8"  (1.727 m)  SpO2 98%   Physical Exam  Constitutional: She is oriented to person, place, and time. She appears well-developed and well-nourished.  HENT:  Head: Normocephalic and atraumatic.  Right Ear: Tympanic membrane, external ear and ear canal normal.  Left Ear: Tympanic membrane, external ear and ear canal normal.  Nose: Mucosal edema present.  Mouth/Throat: Oropharynx is clear and moist. No oropharyngeal exudate, posterior oropharyngeal edema or posterior oropharyngeal erythema. No tonsillar exudate.  Swollen mucosal edema.   Neck: Neck  supple.  Cardiovascular: Normal rate, regular rhythm and normal heart sounds.   Pulmonary/Chest: Effort normal and breath sounds normal. No respiratory distress. She has no wheezes. She has no rales.  Abdominal: Soft. She exhibits no distension. There is no tenderness.  Lymphadenopathy:    She has no cervical adenopathy.  Neurological: She is alert and oriented to person, place, and time.  Skin: Skin is warm and dry.  Psychiatric: She has a normal mood and affect.  Nursing note and vitals reviewed.    ED Treatments / Results  DIAGNOSTIC STUDIES: Oxygen Saturation is 98% on RA, normal by my interpretation.    COORDINATION OF CARE: 10:20 AM Discussed treatment plan with pt at bedside and pt agreed to plan.   Labs (all labs ordered are listed, but only abnormal results are displayed) Labs Reviewed - No data to display  EKG  EKG Interpretation None       Radiology No results found.  Procedures Procedures (including critical care time)  Medications Ordered in ED Medications  acetaminophen (TYLENOL) tablet 650 mg (650 mg Oral Given 09/28/16 1111)  aerochamber Z-Stat Plus/medium 1 each (1 each Other Given 09/28/16 1111)     Initial Impression / Assessment and Plan / ED Course  I have reviewed the triage vital signs and the nursing notes.  Pertinent labs & imaging results that were available during my care of the patient were reviewed by me and considered in my medical decision making (see chart for details).  46 year old female with viral URI. Patient is afebrile, not tachycardic or tachypneic, normotensive, and not hypoxic. Advised symptomatic care. Tessalon and albuterol inhaler fx given. Patient is NAD, non-toxic, with stable VS. Patient is informed of clinical course, understands medical decision making process, and agrees with plan. Opportunity for questions provided and all questions answered. Return precautions given.   Final Clinical Impressions(s) / ED Diagnoses    Final diagnoses:  Upper respiratory tract infection, unspecified type    New Prescriptions New Prescriptions   No medications on file   I personally performed the services described in this documentation, which was scribed in my presence. The recorded information has been reviewed and is accurate.    Bethel BornKelly Marie Gekas, PA-C 09/28/16 1247    Bethel BornKelly Marie Gekas, PA-C 09/28/16 1318    Laurence Spatesachel Morgan Little, MD 09/30/16 2027

## 2016-09-28 NOTE — ED Triage Notes (Addendum)
Pt reports cough for the last 4 days accompanied by sneezing. Reports pain in midline upper abd after coughing. Also complains of a runny nose and back pain. Tried leftover inhaler, which helped symptoms. No obvious wheezing.

## 2016-09-28 NOTE — ED Notes (Signed)
Bed: WTR8 Expected date:  Expected time:  Means of arrival:  Comments: 

## 2016-09-28 NOTE — Discharge Instructions (Signed)
Take Tylenol/Ibuprofen as needed for pain/fever Drink plenty of fluids Return for worsening symptoms

## 2016-10-18 ENCOUNTER — Other Ambulatory Visit: Payer: Self-pay | Admitting: *Deleted

## 2016-10-18 DIAGNOSIS — Z1231 Encounter for screening mammogram for malignant neoplasm of breast: Secondary | ICD-10-CM

## 2016-11-06 ENCOUNTER — Ambulatory Visit: Payer: BLUE CROSS/BLUE SHIELD

## 2016-12-27 ENCOUNTER — Emergency Department (HOSPITAL_COMMUNITY)
Admission: EM | Admit: 2016-12-27 | Discharge: 2016-12-27 | Disposition: A | Discharge status: Home / Self Care | Payer: No Typology Code available for payment source | Attending: Emergency Medicine | Admitting: Emergency Medicine

## 2016-12-27 ENCOUNTER — Encounter (HOSPITAL_COMMUNITY): Payer: Self-pay | Admitting: *Deleted

## 2016-12-27 DIAGNOSIS — R110 Nausea: Secondary | ICD-10-CM | POA: Diagnosis not present

## 2016-12-27 DIAGNOSIS — R197 Diarrhea, unspecified: Secondary | ICD-10-CM | POA: Diagnosis not present

## 2016-12-27 DIAGNOSIS — Z79899 Other long term (current) drug therapy: Secondary | ICD-10-CM | POA: Insufficient documentation

## 2016-12-27 DIAGNOSIS — R1030 Lower abdominal pain, unspecified: Secondary | ICD-10-CM | POA: Diagnosis present

## 2016-12-27 LAB — URINALYSIS, ROUTINE W REFLEX MICROSCOPIC
Bilirubin Urine: NEGATIVE
Glucose, UA: NEGATIVE mg/dL
Ketones, ur: NEGATIVE mg/dL
NITRITE: NEGATIVE
Protein, ur: NEGATIVE mg/dL
Specific Gravity, Urine: 1.025 (ref 1.005–1.030)
pH: 6 (ref 5.0–8.0)

## 2016-12-27 LAB — COMPREHENSIVE METABOLIC PANEL
ALT: 30 U/L (ref 14–54)
AST: 27 U/L (ref 15–41)
Albumin: 3.9 g/dL (ref 3.5–5.0)
Alkaline Phosphatase: 76 U/L (ref 38–126)
Anion gap: 6 (ref 5–15)
BUN: 11 mg/dL (ref 6–20)
CHLORIDE: 107 mmol/L (ref 101–111)
CO2: 26 mmol/L (ref 22–32)
CREATININE: 0.69 mg/dL (ref 0.44–1.00)
Calcium: 8.4 mg/dL — ABNORMAL LOW (ref 8.9–10.3)
GFR calc Af Amer: >60 mL/min (ref >60)
GLUCOSE: 92 mg/dL (ref 65–99)
Potassium: 3.7 mmol/L (ref 3.5–5.1)
SODIUM: 139 mmol/L (ref 135–145)
Total Bilirubin: 1.3 mg/dL — ABNORMAL HIGH (ref 0.3–1.2)
Total Protein: 7.3 g/dL (ref 6.5–8.1)

## 2016-12-27 LAB — CBC
HEMATOCRIT: 42.3 % (ref 36.0–46.0)
Hemoglobin: 14.2 g/dL (ref 12.0–15.0)
MCH: 31.8 pg (ref 26.0–34.0)
MCHC: 33.6 g/dL (ref 30.0–36.0)
MCV: 94.6 fL (ref 78.0–100.0)
Platelets: 287 10*3/uL (ref 150–400)
RBC: 4.47 MIL/uL (ref 3.87–5.11)
RDW: 12.7 % (ref 11.5–15.5)
WBC: 6.1 10*3/uL (ref 4.0–10.5)

## 2016-12-27 LAB — POC URINE PREG, ED: PREG TEST UR: NEGATIVE

## 2016-12-27 MED ORDER — DICYCLOMINE HCL 20 MG PO TABS: 20.0000 mg | ORAL_TABLET | Freq: Two times a day (BID) | ORAL | 0 refills | Status: DC | PRN

## 2016-12-27 MED ORDER — SODIUM CHLORIDE 0.9 % IV BOLUS (SEPSIS)
1000.0000 mL | Freq: Once | INTRAVENOUS | Status: AC
  Administered 2016-12-27: 1000 mL via INTRAVENOUS

## 2016-12-27 MED ORDER — DICYCLOMINE HCL 10 MG PO CAPS
10.0000 mg | ORAL_CAPSULE | Freq: Once | ORAL | Status: AC
  Administered 2016-12-27: 10 mg via ORAL
  Filled 2016-12-27: qty 1

## 2016-12-27 MED ORDER — PROMETHAZINE HCL 25 MG/ML IJ SOLN
25.0000 mg | Freq: Once | INTRAMUSCULAR | Status: AC
  Administered 2016-12-27: 25 mg via INTRAVENOUS
  Filled 2016-12-27 (×2): qty 1

## 2016-12-27 MED ORDER — ONDANSETRON HCL 4 MG PO TABS: 4.0000 mg | ORAL_TABLET | Freq: Four times a day (QID) | ORAL | 0 refills | Status: DC

## 2016-12-27 MED ORDER — ONDANSETRON HCL 4 MG/2ML IJ SOLN
4.0000 mg | Freq: Once | INTRAMUSCULAR | Status: AC
  Administered 2016-12-27: 4 mg via INTRAVENOUS
  Filled 2016-12-27: qty 2

## 2016-12-27 NOTE — ED Notes (Signed)
Pt given cranberry juice for PO challenge

## 2016-12-27 NOTE — ED Notes (Signed)
Pt ambulatory and independent at discharge.  Verbalized understanding of discharge instructions 

## 2016-12-27 NOTE — ED Provider Notes (Signed)
[facility]-EMERGENCY DEPT Provider Note   CSN: [id number] Arrival date & time: 12/27/16  1151     History   Chief Complaint Chief Complaint  Patient presents with  . Abdominal Pain    HPI Sherri Walton is a 46 y.o. female.  The history is provided by the patient. No language interpreter was used.  Abdominal Pain     Sherri Walton is a 46 y.o. female who presents to the Emergency Department complaining of abdominal pain.  She reports lower abdominal pain and cramping described as sharp pain that began last night after eating [employer]. She reports associated severe nausea with multiple episodes of diarrhea throughout the night. No fevers, vomiting. She does have mild dysuria and low back pain. No known sick contacts and no antibiotic use. Past Medical History:  Diagnosis Date  . HLD (hyperlipidemia)     Patient Active Problem List   Diagnosis Date Noted  . Hepatitis B immune 08/26/2012  . DERMATOGRAPHIA 04/18/2010    Past Surgical History:  Procedure Laterality Date  . CESAREAN SECTION    . CESAREAN SECTION    . TUBAL LIGATION    . TUBAL LIGATION      OB History    No data available       Home Medications    Prior to Admission medications   Medication Sig Start Date End Date Taking? Authorizing Provider  albuterol (PROVENTIL HFA;VENTOLIN HFA) 108 (90 Base) MCG/ACT inhaler Inhale 1-2 puffs into the lungs every 6 (six) hours as needed for wheezing or shortness of breath. 09/28/16  Yes Bethel Born, PA-C  EPINEPHrine 0.3 mg/0.3 mL IJ SOAJ injection Self inject per package instructions as needed for severe allergic reaction and seek medical attention. Patient taking differently: Inject 0.3 mg into the skin once. Self inject per package instructions as needed for severe allergic reaction and seek medical attention. 06/23/14  Yes John Molpus, MD  hyoscyamine (LEVSIN/SL) 0.125 MG SL tablet Place 1 tablet (0.125 mg total) under the tongue every 4 (four) hours  as needed. 08/31/14  Yes Tilden Fossa, MD  benzonatate (TESSALON) 100 MG capsule Take 1 capsule (100 mg total) by mouth every 8 (eight) hours. Patient not taking: Reported on 12/27/2016 09/28/16   Bethel Born, PA-C  dicyclomine (BENTYL) 20 MG tablet Take 1 tablet (20 mg total) by mouth 2 (two) times daily as needed for spasms. 12/27/16   Tilden Fossa, MD  HYDROcodone-acetaminophen (NORCO/VICODIN) 5-325 MG per tablet Take 1 tablet by mouth every 4 (four) hours as needed for moderate pain or severe pain. Patient not taking: Reported on 12/27/2016 08/07/14   Everlene Farrier, PA-C  ondansetron (ZOFRAN) 4 MG tablet Take 1 tablet (4 mg total) by mouth every 6 (six) hours. 12/27/16   Tilden Fossa, MD  traMADol-acetaminophen (ULTRACET) 37.5-325 MG per tablet Take 1 tablet by mouth every 6 (six) hours as needed. Patient not taking: Reported on 12/27/2016 12/07/14   Garlon Hatchet, PA-C    Family History Family History  Problem Relation Age of Onset  . Hypertension Mother   . Diabetes Mother   . Cancer Father     Stomach  . Heart disease Brother   . Alcohol abuse Paternal Aunt     Social History Social History  Substance Use Topics  . Smoking status: Never Smoker  . Smokeless tobacco: Never Used  . Alcohol use No     Allergies   Minocycline; Nitrofurantoin; and Ketorolac tromethamine   Review of Systems Review  of Systems  Gastrointestinal: Positive for abdominal pain.  All other systems reviewed and are negative.    Physical Exam Updated Vital Signs BP 121/87   Pulse 86   Temp 98.6 F (37 C) (Oral)   Resp 16   LMP 12/10/2016   SpO2 100%   Physical Exam  Constitutional: She is oriented to person, place, and time. She appears well-developed and well-nourished.  HENT:  Head: Normocephalic and atraumatic.  Cardiovascular: Normal rate and regular rhythm.   No murmur heard. Pulmonary/Chest: Effort normal and breath sounds normal. No respiratory distress.  Abdominal: Soft. There  is no rebound and no guarding.  Mild to moderate lower abdominal tenderness  Musculoskeletal: She exhibits no edema or tenderness.  Neurological: She is alert and oriented to person, place, and time.  Skin: Skin is warm and dry.  Psychiatric: She has a normal mood and affect. Her behavior is normal.  Nursing note and vitals reviewed.    ED Treatments / Results  Labs (all labs ordered are listed, but only abnormal results are displayed) Labs Reviewed  URINALYSIS, ROUTINE W REFLEX MICROSCOPIC - Abnormal; Notable for the following:       Result Value   APPearance HAZY (*)    Hgb urine dipstick MODERATE (*)    Leukocytes, UA LARGE (*)    Bacteria, UA RARE (*)    Squamous Epithelial / LPF 6-30 (*)    All other components within normal limits  COMPREHENSIVE METABOLIC PANEL - Abnormal; Notable for the following:    Calcium 8.4 (*)    Total Bilirubin 1.3 (*)    All other components within normal limits  URINE CULTURE  CBC  POC URINE PREG, ED    EKG  EKG Interpretation None       Radiology No results found.  Procedures Procedures (including critical care time)  Medications Ordered in ED Medications  sodium chloride 0.9 % bolus 1,000 mL (0 mLs Intravenous Stopped 12/27/16 1433)  ondansetron (ZOFRAN) injection 4 mg (4 mg Intravenous Given 12/27/16 1240)  dicyclomine (BENTYL) capsule 10 mg (10 mg Oral Given 12/27/16 1240)  promethazine (PHENERGAN) injection 25 mg (25 mg Intravenous Given 12/27/16 1535)     Initial Impression / Assessment and Plan / ED Course  I have reviewed the triage vital signs and the nursing notes.  Pertinent labs & imaging results that were available during my care of the patient were reviewed by me and considered in my medical decision making (see chart for details).     Pt here for evaluation of nausea, abdominal cramping, diarrhea.  On repeat assesment her abdominal pain has resolved and abdomen is nontender.  D/w pt likely viral illness vs food  poisoning.  Discussed outpatient follow up, oral fluid hydration.  Return precautions discussed.  UA with WBC, bacteria - pt w/o dysuria - will send culture and only treat if positive.   Final Clinical Impressions(s) / ED Diagnoses   Final diagnoses:  Nausea  Lower abdominal pain  Diarrhea, unspecified type    New Prescriptions Discharge Medication List as of 12/27/2016  3:11 PM    START taking these medications   Details  dicyclomine (BENTYL) 20 MG tablet Take 1 tablet (20 mg total) by mouth 2 (two) times daily as needed for spasms., Starting Wed 12/27/2016, Print    ondansetron (ZOFRAN) 4 MG tablet Take 1 tablet (4 mg total) by mouth every 6 (six) hours., Starting Wed 12/27/2016, Print         Tilden Fossa, MD  12/27/16 1714  

## 2016-12-27 NOTE — ED Triage Notes (Signed)
Pt complains of nausea, abdominal pain and diarrhea since last night. Pt denies emesis. Pt states the symptoms started 2-3 hours after eating food from [employer] last night.

## 2016-12-27 NOTE — ED Notes (Signed)
Pt ambulatory to restroom

## 2016-12-28 ENCOUNTER — Ambulatory Visit
Admission: RE | Admit: 2016-12-28 | Discharge: 2016-12-28 | Disposition: A | Discharge status: Home / Self Care | Payer: Medicaid Other | Source: Ambulatory Visit | Attending: *Deleted | Admitting: *Deleted

## 2016-12-28 ENCOUNTER — Other Ambulatory Visit: Payer: Self-pay | Admitting: *Deleted

## 2016-12-28 ENCOUNTER — Ambulatory Visit
Admission: RE | Admit: 2016-12-28 | Discharge: 2016-12-28 | Disposition: A | Discharge status: Home / Self Care | Payer: No Typology Code available for payment source | Source: Ambulatory Visit | Attending: *Deleted | Admitting: *Deleted

## 2016-12-28 DIAGNOSIS — Z1231 Encounter for screening mammogram for malignant neoplasm of breast: Secondary | ICD-10-CM

## 2016-12-28 LAB — URINE CULTURE: CULTURE: NO GROWTH

## 2017-08-21 ENCOUNTER — Emergency Department (HOSPITAL_COMMUNITY)
Admission: EM | Admit: 2017-08-21 | Discharge: 2017-08-21 | Disposition: A | Discharge status: Home / Self Care | Payer: Medicaid Other | Attending: Emergency Medicine | Admitting: Emergency Medicine

## 2017-08-21 ENCOUNTER — Emergency Department (HOSPITAL_COMMUNITY): Payer: Medicaid Other

## 2017-08-21 ENCOUNTER — Other Ambulatory Visit: Payer: Self-pay

## 2017-08-21 ENCOUNTER — Encounter (HOSPITAL_COMMUNITY): Payer: Self-pay | Admitting: Emergency Medicine

## 2017-08-21 DIAGNOSIS — Z79899 Other long term (current) drug therapy: Secondary | ICD-10-CM | POA: Insufficient documentation

## 2017-08-21 DIAGNOSIS — E785 Hyperlipidemia, unspecified: Secondary | ICD-10-CM | POA: Insufficient documentation

## 2017-08-21 DIAGNOSIS — N3000 Acute cystitis without hematuria: Secondary | ICD-10-CM | POA: Diagnosis not present

## 2017-08-21 DIAGNOSIS — R109 Unspecified abdominal pain: Secondary | ICD-10-CM

## 2017-08-21 DIAGNOSIS — R1012 Left upper quadrant pain: Secondary | ICD-10-CM | POA: Diagnosis present

## 2017-08-21 LAB — COMPREHENSIVE METABOLIC PANEL
ALK PHOS: 78 U/L (ref 38–126)
ALT: 27 U/L (ref 14–54)
ANION GAP: 9 (ref 5–15)
AST: 30 U/L (ref 15–41)
Albumin: 4.2 g/dL (ref 3.5–5.0)
BILIRUBIN TOTAL: 1 mg/dL (ref 0.3–1.2)
BUN: 7 mg/dL (ref 6–20)
CALCIUM: 9.6 mg/dL (ref 8.9–10.3)
CO2: 25 mmol/L (ref 22–32)
Chloride: 103 mmol/L (ref 101–111)
Creatinine, Ser: 0.83 mg/dL (ref 0.44–1.00)
Glucose, Bld: 110 mg/dL — ABNORMAL HIGH (ref 65–99)
POTASSIUM: 3.6 mmol/L (ref 3.5–5.1)
Sodium: 137 mmol/L (ref 135–145)
TOTAL PROTEIN: 8.2 g/dL — AB (ref 6.5–8.1)

## 2017-08-21 LAB — CBC WITH DIFFERENTIAL/PLATELET
BASOS ABS: 0 10*3/uL (ref 0.0–0.1)
BASOS PCT: 0 %
Eosinophils Absolute: 0 10*3/uL (ref 0.0–0.7)
Eosinophils Relative: 0 %
HEMATOCRIT: 39.3 % (ref 36.0–46.0)
Hemoglobin: 13.3 g/dL (ref 12.0–15.0)
Lymphocytes Relative: 16 %
Lymphs Abs: 1.5 10*3/uL (ref 0.7–4.0)
MCH: 31.4 pg (ref 26.0–34.0)
MCHC: 33.8 g/dL (ref 30.0–36.0)
MCV: 92.9 fL (ref 78.0–100.0)
Monocytes Absolute: 0.7 10*3/uL (ref 0.1–1.0)
Monocytes Relative: 8 %
NEUTROS ABS: 7 10*3/uL (ref 1.7–7.7)
Neutrophils Relative %: 76 %
PLATELETS: 282 10*3/uL (ref 150–400)
RBC: 4.23 MIL/uL (ref 3.87–5.11)
RDW: 12.8 % (ref 11.5–15.5)
WBC: 9.2 10*3/uL (ref 4.0–10.5)

## 2017-08-21 LAB — LIPASE, BLOOD: Lipase: 31 U/L (ref 11–51)

## 2017-08-21 LAB — URINALYSIS, ROUTINE W REFLEX MICROSCOPIC
BILIRUBIN URINE: NEGATIVE
Glucose, UA: NEGATIVE mg/dL
KETONES UR: NEGATIVE mg/dL
NITRITE: POSITIVE — AB
PH: 8 (ref 5.0–8.0)
Protein, ur: NEGATIVE mg/dL
SPECIFIC GRAVITY, URINE: 1.008 (ref 1.005–1.030)

## 2017-08-21 LAB — I-STAT BETA HCG BLOOD, ED (MC, WL, AP ONLY): I-stat hCG, quantitative: <5 m[IU]/mL (ref <5)

## 2017-08-21 LAB — I-STAT CG4 LACTIC ACID, ED: Lactic Acid, Venous: 1.26 mmol/L (ref 0.5–1.9)

## 2017-08-21 MED ORDER — SODIUM CHLORIDE 0.9 % IV BOLUS (SEPSIS)
1000.0000 mL | Freq: Once | INTRAVENOUS | Status: AC
  Administered 2017-08-21: 1000 mL via INTRAVENOUS

## 2017-08-21 MED ORDER — ACETAMINOPHEN 325 MG PO TABS
650.0000 mg | ORAL_TABLET | Freq: Once | ORAL | Status: AC
  Administered 2017-08-21: 650 mg via ORAL
  Filled 2017-08-21: qty 2

## 2017-08-21 MED ORDER — IBUPROFEN 200 MG PO TABS
600.0000 mg | ORAL_TABLET | Freq: Once | ORAL | Status: AC
  Administered 2017-08-21: 600 mg via ORAL
  Filled 2017-08-21: qty 3

## 2017-08-21 MED ORDER — IOPAMIDOL (ISOVUE-300) INJECTION 61%
INTRAVENOUS | Status: AC
  Administered 2017-08-21: 100 mL via INTRAVENOUS
  Filled 2017-08-21: qty 100

## 2017-08-21 MED ORDER — CEPHALEXIN 500 MG PO CAPS: 500.0000 mg | ORAL_CAPSULE | Freq: Two times a day (BID) | ORAL | 0 refills | Status: AC

## 2017-08-21 MED ORDER — MORPHINE SULFATE (PF) 4 MG/ML IV SOLN
4.0000 mg | Freq: Once | INTRAVENOUS | Status: AC
  Administered 2017-08-21: 4 mg via INTRAVENOUS
  Filled 2017-08-21: qty 1

## 2017-08-21 MED ORDER — IOPAMIDOL (ISOVUE-300) INJECTION 61%
100.0000 mL | Freq: Once | INTRAVENOUS | Status: AC | PRN
  Administered 2017-08-21: 100 mL via INTRAVENOUS

## 2017-08-21 NOTE — ED Notes (Addendum)
Lactic Acid 1.26

## 2017-08-21 NOTE — Discharge Instructions (Signed)
Take antibiotics as prescribed.  Take the entire course of antibiotics, even if your symptoms improve. Use Tylenol or ibuprofen as needed for pain. Make sure you are staying well-hydrated with water. Follow-up with your primary care doctor the end of this week if your symptoms are not improving. Return to the emergency room if you develop persistent high fevers, persistent vomiting, worsening pain, or any new or concerning symptoms.

## 2017-08-21 NOTE — ED Triage Notes (Signed)
Patient complaining of left lower abdominal pain that started two day ago. Patient states she took ibuprofen and it stopped. Then the pain started back last night. Patient states the abdominal pain came back with aches and chills.

## 2017-08-21 NOTE — ED Notes (Signed)
Patient transported to CT 

## 2017-08-21 NOTE — ED Provider Notes (Signed)
[facility]-EMERGENCY DEPT Provider Note   CSN: [id number] Arrival date & time: 08/21/17  [id number]     History   Chief Complaint Chief Complaint  Patient presents with  . Fever  . Generalized Body Aches  . Dysuria  . Abdominal Pain    HPI Sherri Walton is a 46 y.o. female presenting with fever, generalized body aches, and left upper quadrant pain.  She states 2 days ago, she had severe left upper quadrant pain.  This resolved and she was fine yesterday throughout the day.  Last night, pain returned.  It is sharp, constant, nothing makes it better or worse.  She has tried ibuprofen without improvement of the pain.  Boyfriend states that she had a fever at home.  She reports generalized body aches that began last night.  She has not taken anything for the pain or body aches today.  She reports a recent URI last week in which she was coughing frequently.  The cough has improved but not completely resolved.  She denies sore throat, chest pain, difficulty breathing, nausea, vomiting, lower abdominal pain, or abnormal bowel movements.  Patient states she has a discomfort when she urinates and her urine has a weird odor.  She reports a frequent history of UTIs.  She reports a decreased appetite yesterday but no worsening of symptoms with oral intake.  She denies history of similar abd pain.  HPI  Past Medical History:  Diagnosis Date  . HLD (hyperlipidemia)     Patient Active Problem List   Diagnosis Date Noted  . Hepatitis B immune 08/26/2012  . DERMATOGRAPHIA 04/18/2010    Past Surgical History:  Procedure Laterality Date  . CESAREAN SECTION    . CESAREAN SECTION    . TUBAL LIGATION    . TUBAL LIGATION      OB History    No data available       Home Medications    Prior to Admission medications   Medication Sig Start Date End Date Taking? Authorizing Provider  albuterol (PROVENTIL HFA;VENTOLIN HFA) 108 (90 Base) MCG/ACT inhaler Inhale 1-2 puffs  into the lungs every 6 (six) hours as needed for wheezing or shortness of breath. 09/28/16  Yes Bethel BornGekas, Kelly Marie, PA-C  ibuprofen (ADVIL,MOTRIN) 200 MG tablet Take 400 mg by mouth every 6 (six) hours as needed for headache, mild pain or cramping.   Yes [provider]  loratadine (CLARITIN) 10 MG tablet Take 10 mg by mouth daily. 06/22/17  Yes [provider]  cephALEXin (KEFLEX) 500 MG capsule Take 1 capsule (500 mg total) by mouth 2 (two) times daily for 7 days. 08/21/17 08/28/17  Aniko Finnigan, PA-C  dicyclomine (BENTYL) 20 MG tablet Take 1 tablet (20 mg total) by mouth 2 (two) times daily as needed for spasms. Patient not taking: Reported on 08/21/2017 12/27/16   Tilden Fossaees, Elizabeth, MD  EPINEPHrine 0.3 mg/0.3 mL IJ SOAJ injection Self inject per package instructions as needed for severe allergic reaction and seek medical attention. Patient not taking: Reported on 08/21/2017 06/23/14   Molpus, Jonny RuizJohn, MD  hyoscyamine (LEVSIN/SL) 0.125 MG SL tablet Place 1 tablet (0.125 mg total) under the tongue every 4 (four) hours as needed. Patient not taking: Reported on 08/21/2017 08/31/14   Tilden Fossaees, Elizabeth, MD  ondansetron (ZOFRAN) 4 MG tablet Take 1 tablet (4 mg total) by mouth every 6 (six) hours. Patient not taking: Reported on 08/21/2017 12/27/16   Tilden Fossaees, Elizabeth, MD    Family History Family History  Problem Relation Age of Onset  . Hypertension Mother   . Diabetes Mother   . Cancer Father        Stomach  . Heart disease Brother   . Alcohol abuse Paternal Aunt   . Breast cancer Other     Social History Social History   Tobacco Use  . Smoking status: Never Smoker  . Smokeless tobacco: Never Used  Substance Use Topics  . Alcohol use: No  . Drug use: No     Allergies   Minocycline; Nitrofurantoin; and Ketorolac tromethamine   Review of Systems Review of Systems  Constitutional: Positive for fever.  HENT: Negative for congestion and sore throat.   Respiratory: Positive  for cough. Negative for chest tightness and shortness of breath.   Cardiovascular: Negative for chest pain and leg swelling.  Gastrointestinal: Positive for abdominal pain. Negative for constipation, diarrhea, nausea and vomiting.  Genitourinary: Positive for dysuria. Negative for frequency and hematuria.       Urinary odor  Musculoskeletal: Positive for myalgias. Negative for back pain.  Skin: Negative for rash.  Allergic/Immunologic: Negative for immunocompromised state.  Neurological: Negative for dizziness and headaches.  Hematological: Does not bruise/bleed easily.     Physical Exam Updated Vital Signs BP 107/76   Pulse 81   Temp 98.4 F (36.9 C) (Oral)   Resp 18   Ht 5\' 8"  (1.727 m)   Wt 99.8 kg (220 lb)   LMP 07/31/2017 Comment: negative HCG blood test 08-21-2017  SpO2 98%   BMI 33.45 kg/m   Physical Exam  Constitutional: She is oriented to person, place, and time. She appears well-developed and well-nourished. No distress.  HENT:  Head: Normocephalic and atraumatic.  Eyes: EOM are normal.  Neck: Normal range of motion.  Cardiovascular: Normal rate, regular rhythm and intact distal pulses.  Pulmonary/Chest: Effort normal and breath sounds normal. No respiratory distress. She has no wheezes. She exhibits no tenderness.  Abdominal: Soft. Normal appearance and bowel sounds are normal. She exhibits no distension and no mass. There is tenderness in the left upper quadrant. There is no rigidity, no rebound, no guarding and no CVA tenderness.  Mild tenderness palpation of left upper quadrant.  No tenderness elsewhere in the abdomen.  No rigidity, guarding, distention.  No flank pain or CVA tenderness.  No suprapubic discomfort.  Musculoskeletal: Normal range of motion.  Neurological: She is alert and oriented to person, place, and time.  Skin: Skin is warm and dry.  Psychiatric: She has a normal mood and affect.  Nursing note and vitals reviewed.    ED Treatments /  Results  Labs (all labs ordered are listed, but only abnormal results are displayed) Labs Reviewed  COMPREHENSIVE METABOLIC PANEL - Abnormal; Notable for the following components:      Result Value   Glucose, Bld 110 (*)    Total Protein 8.2 (*)    All other components within normal limits  URINALYSIS, ROUTINE W REFLEX MICROSCOPIC - Abnormal; Notable for the following components:   Hgb urine dipstick MODERATE (*)    Nitrite POSITIVE (*)    Leukocytes, UA SMALL (*)    Bacteria, UA MANY (*)    Squamous Epithelial / LPF 0-5 (*)    All other components within normal limits  CBC WITH DIFFERENTIAL/PLATELET  LIPASE, BLOOD  I-STAT CG4 LACTIC ACID, ED  I-STAT BETA HCG BLOOD, ED ([facility], [facility], [facility] ONLY)    EKG  EKG Interpretation None       Radiology Dg  Chest 2 View  Result Date: 08/21/2017 CLINICAL DATA:  Fever beginning last night with chills and some coughing. EXAM: CHEST  2 VIEW COMPARISON:  04/14/2014 FINDINGS: The heart size and mediastinal contours are within normal limits. Both lungs are clear. The visualized skeletal structures are unremarkable. IMPRESSION: No active cardiopulmonary disease. Electronically Signed   By: Elberta Fortisaniel  Boyle M.D.   On: 08/21/2017 07:49   Ct Abdomen Pelvis W Contrast  Result Date: 08/21/2017 CLINICAL DATA:  46 year old female with left lower abdominal pain, aches and chills EXAM: CT ABDOMEN AND PELVIS WITH CONTRAST TECHNIQUE: Multidetector CT imaging of the abdomen and pelvis was performed using the standard protocol following bolus administration of intravenous contrast. CONTRAST:  100 mL Isovue 350 COMPARISON:  None. FINDINGS: Lower chest: Mild left lower lobe atelectasis. Otherwise, the visualized lower chest is unremarkable. Hepatobiliary: Normal hepatic contour and morphology. No discrete hepatic lesion. Small calcified foci layer within the gallbladder lumen consistent with cholelithiasis. No evidence biliary duct dilatation or acute cholecystitis.  Pancreas: Unremarkable. No pancreatic ductal dilatation or surrounding inflammatory changes. Spleen: Normal in size without focal abnormality. Adrenals/Urinary Tract: Adrenal glands are unremarkable. Kidneys are normal, without renal calculi, focal lesion, or hydronephrosis. Bladder is unremarkable. Stomach/Bowel: No evidence of obstruction or focal bowel wall thickening. Normal appendix in the right lower quadrant. The terminal ileum is unremarkable. Vascular/Lymphatic: No significant vascular findings are present. No enlarged abdominal or pelvic lymph nodes. Reproductive: Normal appearance of the uterus and right ovary. There is a small 1.8 cm intermediate attenuation cyst within the left ovary which may represent a small hemorrhagic cyst. Other: No abdominal wall hernia or abnormality. No abdominopelvic ascites. Musculoskeletal: No acute or significant osseous findings. IMPRESSION: 1. Small (1.8 cm) mildly complex left ovarian cyst. This likely represents a hemorrhagic cyst and could be the source of the patient's left lower quadrant abdominal pain. 2. Cholelithiasis. 3. Otherwise, unremarkable CT scan of the abdomen and pelvis. Electronically Signed   By: Malachy MoanHeath  McCullough M.D.   On: 08/21/2017 11:08    Procedures Procedures (including critical care time)  Medications Ordered in ED Medications  sodium chloride 0.9 % bolus 1,000 mL (0 mLs Intravenous Stopped 08/21/17 0847)  morphine 4 MG/ML injection 4 mg (4 mg Intravenous Given 08/21/17 0807)  acetaminophen (TYLENOL) tablet 650 mg (650 mg Oral Given 08/21/17 0732)  iopamidol (ISOVUE-300) 61 % injection 100 mL (100 mLs Intravenous Contrast Given 08/21/17 1036)  ibuprofen (ADVIL,MOTRIN) tablet 600 mg (600 mg Oral Given 08/21/17 1155)     Initial Impression / Assessment and Plan / ED Course  I have reviewed the triage vital signs and the nursing notes.  Pertinent labs & imaging results that were available during my care of the patient were  reviewed by me and considered in my medical decision making (see chart for details).     Patient presenting for evaluation of abdominal pain and fever.  Physical exam shows patient is febrile and slightly tachycardic.  Blood pressure is stable.  She appears uncomfortable due to pain.  Mild tenderness palpation of left upper quadrant and epigastric area.  No rigidity, guarding, distention.  Will obtain basic abdominal labs, chest x-ray, and CT abdomen (last one in 2004).  Will start fluids and give morphine for pain.  Tylenol for fever and body aches.  Fever and heart rate improved.  On reassessment, patient states pain is resolved with morphine.  Labs reassuring, no leukocytosis.  Lactate normal.  Creatinine stable.  Urine pending.  Chest x-ray normal.  Urine shows signs of infection.  CT reassuring, no sign of infection, obstruction, or perforation.  Ovarian cyst noted, however patient's pain is upper quadrant, doubt this is the source of her pain.  Will treat UTI with antibiotics, pain with Tylenol or ibuprofen.  Patient follow-up with primary care.  At this time, patient appears safe for discharge.  Return precautions given.  Patient states she understands and agrees to plan.    Final Clinical Impressions(s) / ED Diagnoses   Final diagnoses:  Acute cystitis without hematuria  Acute abdominal pain    ED Discharge Orders        Ordered    cephALEXin (KEFLEX) 500 MG capsule  2 times daily     08/21/17 1227       [place], PA-C 08/21/17 1231    Lavera Guise, MD 08/21/17 1744

## 2017-11-15 ENCOUNTER — Other Ambulatory Visit: Payer: Self-pay

## 2017-11-19 ENCOUNTER — Other Ambulatory Visit: Payer: Self-pay | Admitting: Family Medicine

## 2017-11-19 DIAGNOSIS — Z1231 Encounter for screening mammogram for malignant neoplasm of breast: Secondary | ICD-10-CM

## 2017-12-27 ENCOUNTER — Encounter: Payer: Self-pay | Admitting: Podiatry

## 2017-12-27 ENCOUNTER — Ambulatory Visit: Payer: Medicaid Other | Admitting: Podiatry

## 2017-12-27 DIAGNOSIS — M79671 Pain in right foot: Secondary | ICD-10-CM

## 2017-12-27 DIAGNOSIS — M79672 Pain in left foot: Principal | ICD-10-CM

## 2017-12-27 DIAGNOSIS — M216X1 Other acquired deformities of right foot: Secondary | ICD-10-CM

## 2017-12-27 DIAGNOSIS — M216X9 Other acquired deformities of unspecified foot: Secondary | ICD-10-CM

## 2017-12-27 NOTE — Progress Notes (Signed)
Subjective:   Patient ID: Sherri Walton, female   DOB: 47 y.o.   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

## 2017-12-31 ENCOUNTER — Ambulatory Visit: Payer: Self-pay | Admitting: Podiatry

## 2017-12-31 ENCOUNTER — Ambulatory Visit
Admission: RE | Admit: 2017-12-31 | Discharge: 2017-12-31 | Disposition: A | Discharge status: Home / Self Care | Payer: 59 | Source: Ambulatory Visit | Attending: Family Medicine | Admitting: Family Medicine

## 2017-12-31 DIAGNOSIS — Z1231 Encounter for screening mammogram for malignant neoplasm of breast: Secondary | ICD-10-CM

## 2018-01-04 ENCOUNTER — Telehealth: Payer: Self-pay | Admitting: Podiatry

## 2018-01-04 NOTE — Telephone Encounter (Signed)
Spoke with patient on the phone she stated she wanted to get molded for orthotics by [employer]. I needed verbal disclosure from Dr Alphonse Guild CMA's that she needed to be seen by [facility] before she sees Raiford Noble since it doesn't disclose anything in the dictation. Called patient back and she said she was speaking to someone else who was handling it.

## 2018-01-08 ENCOUNTER — Telehealth: Payer: Self-pay | Admitting: Podiatry

## 2018-01-08 NOTE — Telephone Encounter (Signed)
Called pt to provide orthotic coverage and voicemail is full.

## 2018-01-08 NOTE — Telephone Encounter (Signed)
Pt returned a call and is aware insurance will not cover orthotics and the cost is 398.00.  Pt cxled appt for now until she is able to cover financially

## 2018-01-14 ENCOUNTER — Other Ambulatory Visit: Payer: 59 | Admitting: Orthotics

## 2018-04-15 ENCOUNTER — Ambulatory Visit: Payer: Medicaid Other | Admitting: Podiatry

## 2018-04-26 ENCOUNTER — Encounter: Payer: Self-pay | Admitting: Podiatry

## 2018-04-26 ENCOUNTER — Ambulatory Visit: Payer: Medicaid Other | Admitting: Podiatry

## 2018-04-26 ENCOUNTER — Ambulatory Visit (INDEPENDENT_AMBULATORY_CARE_PROVIDER_SITE_OTHER): Payer: Medicaid Other

## 2018-04-26 ENCOUNTER — Other Ambulatory Visit: Payer: Self-pay | Admitting: Podiatry

## 2018-04-26 DIAGNOSIS — M779 Enthesopathy, unspecified: Secondary | ICD-10-CM

## 2018-04-26 DIAGNOSIS — M7751 Other enthesopathy of right foot: Secondary | ICD-10-CM | POA: Diagnosis not present

## 2018-04-26 DIAGNOSIS — M778 Other enthesopathies, not elsewhere classified: Secondary | ICD-10-CM

## 2018-04-26 DIAGNOSIS — M79672 Pain in left foot: Principal | ICD-10-CM

## 2018-04-26 DIAGNOSIS — M7752 Other enthesopathy of left foot: Secondary | ICD-10-CM

## 2018-04-26 DIAGNOSIS — M79671 Pain in right foot: Secondary | ICD-10-CM

## 2018-04-26 MED ORDER — TRIAMCINOLONE ACETONIDE 10 MG/ML IJ SUSP
10.0000 mg | Freq: Once | INTRAMUSCULAR | Status: AC
  Administered 2018-04-26: 10 mg

## 2018-04-26 NOTE — Progress Notes (Signed)
Subjective:   Patient ID: Sherri Walton, female   DOB: 47 y.o.   MRN: [id number]   HPI Patient states she is been getting a lot of pain on top of both feet and feels like the tendons are inflamed   ROS      Objective:  Physical Exam  Inflammation of the extensor complex tendon bilateral with no history of injury     Assessment:  Extensor tendon tendinitis bilateral     Plan:  Sterile prep applied injected the extensor tendon complex bilateral 3 mg Kenalog problem Xylocaine advised on reduced shoe gear usage and reduced activity and reappoint to recheck again if symptoms indicate

## 2018-08-05 ENCOUNTER — Encounter (HOSPITAL_COMMUNITY): Payer: Self-pay | Admitting: Nurse Practitioner

## 2018-08-05 ENCOUNTER — Emergency Department (HOSPITAL_COMMUNITY)
Admission: EM | Admit: 2018-08-05 | Discharge: 2018-08-05 | Disposition: A | Discharge status: Home / Self Care | Payer: Self-pay | Attending: Emergency Medicine | Admitting: Emergency Medicine

## 2018-08-05 ENCOUNTER — Emergency Department (HOSPITAL_COMMUNITY): Payer: Self-pay

## 2018-08-05 DIAGNOSIS — R002 Palpitations: Secondary | ICD-10-CM | POA: Insufficient documentation

## 2018-08-05 LAB — CBC
HCT: 43.2 % (ref 36.0–46.0)
HEMOGLOBIN: 13.8 g/dL (ref 12.0–15.0)
MCH: 30.7 pg (ref 26.0–34.0)
MCHC: 31.9 g/dL (ref 30.0–36.0)
MCV: 96 fL (ref 80.0–100.0)
Platelets: 280 10*3/uL (ref 150–400)
RBC: 4.5 MIL/uL (ref 3.87–5.11)
RDW: 12.8 % (ref 11.5–15.5)
WBC: 6.2 10*3/uL (ref 4.0–10.5)
nRBC: 0 % (ref 0.0–0.2)

## 2018-08-05 LAB — BASIC METABOLIC PANEL
Anion gap: 9 (ref 5–15)
BUN: 14 mg/dL (ref 6–20)
CO2: 23 mmol/L (ref 22–32)
Calcium: 9.2 mg/dL (ref 8.9–10.3)
Chloride: 106 mmol/L (ref 98–111)
Creatinine, Ser: 0.7 mg/dL (ref 0.44–1.00)
GFR calc Af Amer: >60 mL/min (ref >60)
GFR calc non Af Amer: >60 mL/min (ref >60)
GLUCOSE: 88 mg/dL (ref 70–99)
POTASSIUM: 3.6 mmol/L (ref 3.5–5.1)
Sodium: 138 mmol/L (ref 135–145)

## 2018-08-05 LAB — I-STAT BETA HCG BLOOD, ED (NOT ORDERABLE): I-stat hCG, quantitative: <5 m[IU]/mL (ref <5)

## 2018-08-05 LAB — POCT I-STAT TROPONIN I: Troponin i, poc: 0 ng/mL (ref 0.00–0.08)

## 2018-08-05 NOTE — Discharge Instructions (Addendum)
Your work-up in the emergency department today has been reassuring and did not reveal a concerning cause of your palpitations and chest discomfort.  We advise follow-up with your primary care doctor.  If desired, you may also follow-up with cardiology.  Return to the ED for new or concerning symptoms

## 2018-08-05 NOTE — ED Triage Notes (Signed)
Pt is c/o mild chest discomfort with associated palpation, denies any cardiopulmonary hx.

## 2018-08-06 NOTE — ED Provider Notes (Signed)
[facility]-EMERGENCY DEPT Provider Note   CSN: [id number] Arrival date & time: 08/05/18  0021     History   Chief Complaint Chief Complaint  Patient presents with  . Chest Pain  . Palpitations    HPI Sherri Walton is a 47 y.o. female.  47 year old female with a history of diet-controlled dyslipidemia presents to the emergency department for evaluation of palpitations.  States that she has noted sporadic episodes throughout the day where it seems as though her heart will skip a beat.  She has some discomfort in her chest when this occurs and it causes her to catch her breath.  She denies any significant chest pain or shortness of breath with her palpitations.  Denies use of stimulants or caffeine.  She has been under increased stress lately, working 2 jobs and only sleeping 2 to 3 hours per night.  She denies any recent fevers or viral illness.  No nausea, vomiting, diaphoresis, syncope or near syncope, leg swelling.  She has a history of ischemic cardiomyopathy in her brother at a young age.  This was evaluated by a cardiologist in 2000; patient has not had any issues since this time. She is presently asymptomatic.  The history is provided by the patient. No language interpreter was used.  Chest Pain   Associated symptoms include palpitations.  Palpitations   Associated symptoms include chest pain.    Past Medical History:  Diagnosis Date  . HLD (hyperlipidemia)     Patient Active Problem List   Diagnosis Date Noted  . Hepatitis B immune 08/26/2012  . DERMATOGRAPHIA 04/18/2010    Past Surgical History:  Procedure Laterality Date  . CESAREAN SECTION    . CESAREAN SECTION    . TUBAL LIGATION    . TUBAL LIGATION       OB History   None      Home Medications    Prior to Admission medications   Medication Sig Start Date End Date Taking? Authorizing Provider  fluticasone (FLONASE) 50 MCG/ACT nasal spray Place 1 spray into both nostrils  daily as needed for allergies or rhinitis.   Yes [provider]  ibuprofen (ADVIL,MOTRIN) 200 MG tablet Take 200 mg by mouth every 6 (six) hours as needed for headache, mild pain or cramping.    Yes [provider]  loratadine (CLARITIN) 10 MG tablet Take 10 mg by mouth daily as needed for allergies.  06/22/17  Yes [provider]  albuterol (PROVENTIL HFA;VENTOLIN HFA) 108 (90 Base) MCG/ACT inhaler Inhale 1-2 puffs into the lungs every 6 (six) hours as needed for wheezing or shortness of breath. Patient not taking: Reported on 08/05/2018 09/28/16   Bethel Born, PA-C  dicyclomine (BENTYL) 20 MG tablet Take 1 tablet (20 mg total) by mouth 2 (two) times daily as needed for spasms. Patient not taking: Reported on 08/05/2018 12/27/16   Tilden Fossa, MD  EPINEPHrine 0.3 mg/0.3 mL IJ SOAJ injection Self inject per package instructions as needed for severe allergic reaction and seek medical attention. Patient not taking: Reported on 08/05/2018 06/23/14   Molpus, John, MD  hyoscyamine (LEVSIN/SL) 0.125 MG SL tablet Place 1 tablet (0.125 mg total) under the tongue every 4 (four) hours as needed. Patient not taking: Reported on 08/05/2018 08/31/14   Tilden Fossa, MD  ondansetron (ZOFRAN) 4 MG tablet Take 1 tablet (4 mg total) by mouth every 6 (six) hours. Patient not taking: Reported on 08/05/2018 12/27/16   Tilden Fossa, MD  Family History Family History  Problem Relation Age of Onset  . Hypertension Mother   . Diabetes Mother   . Cancer Father        Stomach  . Heart disease Brother   . Alcohol abuse Paternal Aunt   . Breast cancer Other     Social History Social History   Tobacco Use  . Smoking status: Never Smoker  . Smokeless tobacco: Never Used  Substance Use Topics  . Alcohol use: No  . Drug use: No     Allergies   Minocycline; Nitrofurantoin; and Ketorolac tromethamine   Review of Systems Review of Systems  Cardiovascular: Positive for chest  pain and palpitations.  Ten systems reviewed and are negative for acute change, except as noted in the HPI.    Physical Exam Updated Vital Signs BP 100/80 (BP Location: Right Arm)   Pulse 76   Temp 97.8 F (36.6 C) (Oral)   Resp 18   Ht 5\' 8"  (1.727 m)   Wt 99.8 kg   LMP 07/06/2018   SpO2 97%   BMI 33.45 kg/m   Physical Exam  Constitutional: She is oriented to person, place, and time. She appears well-developed and well-nourished. No distress.  Nontoxic appearing and in NAD  HENT:  Head: Normocephalic and atraumatic.  Eyes: Conjunctivae and EOM are normal. No scleral icterus.  Neck: Normal range of motion.  Cardiovascular: Normal rate, regular rhythm and intact distal pulses.  Pulmonary/Chest: Effort normal. No respiratory distress.  Respirations even and unlabored  Musculoskeletal: Normal range of motion.  No BLE edema  Neurological: She is alert and oriented to person, place, and time. She exhibits normal muscle tone. Coordination normal.  GCS 15. Moving all extremities spontaneously.  Skin: Skin is warm and dry. No rash noted. She is not diaphoretic. No erythema. No pallor.  Psychiatric: She has a normal mood and affect. Her behavior is normal.  Nursing note and vitals reviewed.    ED Treatments / Results  Labs (all labs ordered are listed, but only abnormal results are displayed) Labs Reviewed  BASIC METABOLIC PANEL  CBC  I-STAT BETA HCG BLOOD, ED (NOT ORDERABLE)  POCT I-STAT TROPONIN I    EKG EKG Interpretation  Date/Time:  Monday August 05 2018 00:42:51 EST Ventricular Rate:  74 PR Interval:    QRS Duration: 91 QT Interval:  404 QTC Calculation: 449 R Axis:   33 Text Interpretation:  Sinus rhythm Abnormal R-wave progression, early transition Borderline T wave abnormalities Confirmed by Zadie Rhine ([id number]) on 08/05/2018 12:55:04 AM Also confirmed by Zadie Rhine ([id number]), editor Elita Quick [phone])  on 08/05/2018 7:25:28  AM   Radiology Dg Chest 2 View  Result Date: 08/05/2018 CLINICAL DATA:  Mild chest pain. EXAM: CHEST - 2 VIEW COMPARISON:  08/21/2017. FINDINGS: Normal sized heart. Clear lungs. Minimal peribronchial thickening. Unremarkable bones. IMPRESSION: Minimal bronchitic changes. Electronically Signed   By: Beckie Salts M.D.   On: 08/05/2018 01:37    Procedures Procedures (including critical care time)  Medications Ordered in ED Medications - No data to display   Initial Impression / Assessment and Plan / ED Course  I have reviewed the triage vital signs and the nursing notes.  Pertinent labs & imaging results that were available during my care of the patient were reviewed by me and considered in my medical decision making (see chart for details).     Patient presents to the emergency department for evaluation of palpitations.  Low suspicion for emergent cardiac etiology  given reassuring workup today.  EKG is nonischemic and troponin negative.  Patient has a heart score of 2 consistent with low risk of acute coronary event.  Chest x-ray without evidence of mediastinal widening to suggest dissection.  No pneumothorax, pneumonia, pleural effusion.  Pulmonary embolus further considered; however, patient without tachycardia, tachypnea, dyspnea, hypoxia.  Patient is PERC negative.  Suspect palpitations to be secondary to increased stress and lack of sleep.  Will refer to cardiology for follow-up should patient require Holter monitoring or additional evaluation for ongoing symptoms.  Encouraged to avoid caffeine or other stimulants.  Do not believe further emergent work-up is indicated at this time.  Return precautions discussed and provided. Patient discharged in stable condition with no unaddressed concerns.  Vitals:   08/05/18 0042 08/05/18 0445 08/05/18 0447 08/05/18 0600  BP: 118/82 (!) 126/93 (!) 126/93 100/80  Pulse: 77  76 76  Resp: 16  15 18   Temp: 97.8 F (36.6 C)     TempSrc: Oral      SpO2: 98%  99% 97%  Weight:      Height:        Final Clinical Impressions(s) / ED Diagnoses   Final diagnoses:  Palpitations    ED Discharge Orders    None       Antony MaduraHumes, Shanvi Moyd, PA-C 08/06/18 0556    Zadie RhineWickline, Donald, MD 08/07/18 [phone]

## 2019-01-08 ENCOUNTER — Other Ambulatory Visit: Payer: Self-pay | Admitting: Family Medicine

## 2019-01-08 DIAGNOSIS — Z1231 Encounter for screening mammogram for malignant neoplasm of breast: Secondary | ICD-10-CM

## 2019-02-01 ENCOUNTER — Other Ambulatory Visit: Payer: Self-pay

## 2019-02-01 ENCOUNTER — Ambulatory Visit
Admission: RE | Admit: 2019-02-01 | Discharge: 2019-02-01 | Disposition: A | Discharge status: Home / Self Care | Payer: Self-pay | Source: Ambulatory Visit | Attending: Family Medicine | Admitting: Family Medicine

## 2019-02-01 DIAGNOSIS — Z1231 Encounter for screening mammogram for malignant neoplasm of breast: Secondary | ICD-10-CM

## 2019-09-23 ENCOUNTER — Other Ambulatory Visit: Payer: Self-pay | Admitting: Family Medicine

## 2019-09-23 DIAGNOSIS — Z1231 Encounter for screening mammogram for malignant neoplasm of breast: Secondary | ICD-10-CM

## 2019-10-02 ENCOUNTER — Ambulatory Visit: Payer: BC Managed Care – PPO | Admitting: Podiatry

## 2019-10-02 ENCOUNTER — Other Ambulatory Visit: Payer: Self-pay | Admitting: Podiatry

## 2019-10-02 ENCOUNTER — Ambulatory Visit (INDEPENDENT_AMBULATORY_CARE_PROVIDER_SITE_OTHER): Payer: BC Managed Care – PPO

## 2019-10-02 ENCOUNTER — Other Ambulatory Visit: Payer: Self-pay

## 2019-10-02 ENCOUNTER — Encounter: Payer: Self-pay | Admitting: Podiatry

## 2019-10-02 VITALS — Temp 97.2°F

## 2019-10-02 DIAGNOSIS — M79671 Pain in right foot: Secondary | ICD-10-CM

## 2019-10-02 DIAGNOSIS — M79672 Pain in left foot: Secondary | ICD-10-CM

## 2019-10-02 DIAGNOSIS — M778 Other enthesopathies, not elsewhere classified: Secondary | ICD-10-CM | POA: Diagnosis not present

## 2019-10-02 DIAGNOSIS — Q828 Other specified congenital malformations of skin: Secondary | ICD-10-CM | POA: Diagnosis not present

## 2019-10-02 DIAGNOSIS — M779 Enthesopathy, unspecified: Secondary | ICD-10-CM

## 2019-10-02 NOTE — Progress Notes (Signed)
G

## 2019-10-02 NOTE — Progress Notes (Signed)
Subjective:   Patient ID: Sherri Walton, female   DOB: 49 y.o.   MRN: [id number]   HPI Patient presents stating I had a lot of lesions on both my feet and also I have possibility for a limb length discrepancy secondary to an accident I had years ago.  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  Patient does not smoke and likes to be active   Review of Systems  All other systems reviewed and are negative.       Objective:  Physical Exam Vitals and nursing note reviewed.  Constitutional:      Appearance: She is well-developed.  Pulmonary:     Effort: Pulmonary effort is normal.  Musculoskeletal:        General: Normal range of motion.  Skin:    General: Skin is warm.  Neurological:     Mental Status: She is alert.     Neurovascular status intact muscle strength adequate range of motion within normal limits.  Patient's found to have keratotic tissue formation submetatarsals bilateral with moderate flatfoot deformity and discomfort if she is on her feet for too long with also low-grade lower back problems and leg problems     Assessment:  Problem secondary to foot structure keratotic lesion formation     Plan:  H&P x-rays reviewed and today I went ahead debrided lesions discussed long-term orthotics and casted for functional orthotics to lift up the arch.  Patient will be seen back for [place] to recheck again in the next 4 weeks and for orthotics dispensing is encouraged to call with questions concerns  X-rays indicate depression of the arch with no indications of arthritis or history of stress fracture

## 2019-10-29 ENCOUNTER — Other Ambulatory Visit: Payer: Self-pay

## 2019-10-29 ENCOUNTER — Ambulatory Visit (INDEPENDENT_AMBULATORY_CARE_PROVIDER_SITE_OTHER): Payer: BC Managed Care – PPO

## 2019-10-29 ENCOUNTER — Other Ambulatory Visit: Payer: Self-pay | Admitting: Podiatry

## 2019-10-29 ENCOUNTER — Encounter: Payer: Self-pay | Admitting: Podiatry

## 2019-10-29 ENCOUNTER — Ambulatory Visit (INDEPENDENT_AMBULATORY_CARE_PROVIDER_SITE_OTHER): Payer: BC Managed Care – PPO | Admitting: Podiatry

## 2019-10-29 VITALS — Temp 97.2°F

## 2019-10-29 DIAGNOSIS — M779 Enthesopathy, unspecified: Secondary | ICD-10-CM

## 2019-10-29 DIAGNOSIS — M25571 Pain in right ankle and joints of right foot: Secondary | ICD-10-CM

## 2019-10-29 DIAGNOSIS — M778 Other enthesopathies, not elsewhere classified: Secondary | ICD-10-CM

## 2019-10-29 DIAGNOSIS — M25572 Pain in left ankle and joints of left foot: Secondary | ICD-10-CM

## 2019-10-29 NOTE — Progress Notes (Signed)
Subjective:   Patient ID: Sherri Walton, female   DOB: 49 y.o.   MRN: [id number]   HPI Patient presents with pain in both ankles and swelling and states it is making it hard for her to walk.  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

## 2019-11-03 ENCOUNTER — Ambulatory Visit: Payer: Medicaid Other | Attending: Internal Medicine

## 2019-11-03 DIAGNOSIS — Z20822 Contact with and (suspected) exposure to covid-19: Secondary | ICD-10-CM

## 2019-11-04 LAB — NOVEL CORONAVIRUS, NAA: SARS-CoV-2, NAA: NOT DETECTED

## 2019-11-07 ENCOUNTER — Other Ambulatory Visit: Payer: BC Managed Care – PPO | Admitting: Orthotics

## 2020-02-04 ENCOUNTER — Other Ambulatory Visit: Payer: Self-pay

## 2020-02-04 ENCOUNTER — Ambulatory Visit
Admission: RE | Admit: 2020-02-04 | Discharge: 2020-02-04 | Disposition: A | Discharge status: Home / Self Care | Payer: BC Managed Care – PPO | Source: Ambulatory Visit | Attending: Family Medicine | Admitting: Family Medicine

## 2020-02-04 DIAGNOSIS — Z1231 Encounter for screening mammogram for malignant neoplasm of breast: Secondary | ICD-10-CM

## 2020-03-05 ENCOUNTER — Encounter: Payer: Self-pay | Admitting: Gastroenterology

## 2020-04-28 ENCOUNTER — Encounter: Payer: Self-pay | Admitting: Gastroenterology

## 2020-04-28 ENCOUNTER — Other Ambulatory Visit: Payer: Self-pay

## 2020-04-28 ENCOUNTER — Ambulatory Visit (AMBULATORY_SURGERY_CENTER): Payer: Self-pay

## 2020-04-28 VITALS — Ht 67.0 in | Wt 231.0 lb

## 2020-04-28 DIAGNOSIS — Z1211 Encounter for screening for malignant neoplasm of colon: Secondary | ICD-10-CM

## 2020-04-28 MED ORDER — SUTAB 1479-225-188 MG PO TABS: 1.0000 | ORAL_TABLET | ORAL | 0 refills | Status: DC

## 2020-04-28 NOTE — Progress Notes (Signed)
No egg or soy allergy known to patient  No issues with past sedation with any surgeries or procedures No intubation problems in the past  No FH of Malignant Hyperthermia No diet pills per patient No home 02 use per patient  No blood thinners per patient  Pt denies issues with constipation  No A fib or A flutter  EMMI video via MyChart  COVID 19 guidelines implemented in PV today with Pt and RN  Coupon given to pt in PV today , Code to Pharmacy  COVID vaccines completed on 10/2019 per pt;  Due to the COVID-19 pandemic we are asking patients to follow these guidelines. Please only bring one care partner. Please be aware that your care partner may wait in the car in the parking lot or if they feel like they will be too hot to wait in the car, they may wait in the lobby on the 4th floor. All care partners are required to wear a mask the entire time (we do not have any that we can provide them), they need to practice social distancing, and we will do a Covid check for all patient's and care partners when you arrive. Also we will check their temperature and your temperature. If the care partner waits in their car they need to stay in the parking lot the entire time and we will call them on their cell phone when the patient is ready for discharge so they can bring the car to the front of the building. Also all patient's will need to wear a mask into building.  

## 2020-05-06 ENCOUNTER — Ambulatory Visit (INDEPENDENT_AMBULATORY_CARE_PROVIDER_SITE_OTHER): Payer: BC Managed Care – PPO | Admitting: Podiatry

## 2020-05-06 ENCOUNTER — Encounter: Payer: Self-pay | Admitting: Podiatry

## 2020-05-06 ENCOUNTER — Other Ambulatory Visit: Payer: Self-pay

## 2020-05-06 DIAGNOSIS — M779 Enthesopathy, unspecified: Secondary | ICD-10-CM | POA: Diagnosis not present

## 2020-05-06 NOTE — Progress Notes (Signed)
Subjective:   Patient ID: Sherri Walton, female   DOB: 49 y.o.   MRN: [id number]   HPI Patient states she is getting pain in her ankles again and she simply cannot stand at work and needs note for sitting.  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

## 2020-05-07 ENCOUNTER — Telehealth: Payer: Self-pay | Admitting: Podiatry

## 2020-05-07 NOTE — Telephone Encounter (Signed)
Pt is requesting a new note stating she has no restrictions for work effective 05-07-2020.  Pt req note be emailed to her @   [email or web address]

## 2020-05-10 ENCOUNTER — Encounter: Payer: Self-pay | Admitting: Podiatry

## 2020-05-10 NOTE — Telephone Encounter (Signed)
That is fine to send.

## 2020-05-12 ENCOUNTER — Ambulatory Visit (AMBULATORY_SURGERY_CENTER): Payer: BC Managed Care – PPO | Admitting: Gastroenterology

## 2020-05-12 ENCOUNTER — Ambulatory Visit: Payer: BC Managed Care – PPO | Admitting: Podiatry

## 2020-05-12 ENCOUNTER — Other Ambulatory Visit: Payer: Self-pay

## 2020-05-12 ENCOUNTER — Encounter: Payer: Self-pay | Admitting: Gastroenterology

## 2020-05-12 VITALS — BP 108/69 | HR 68 | Temp 97.5°F | Resp 25 | Ht 67.0 in | Wt 231.0 lb

## 2020-05-12 DIAGNOSIS — Z1211 Encounter for screening for malignant neoplasm of colon: Secondary | ICD-10-CM

## 2020-05-12 MED ORDER — SODIUM CHLORIDE 0.9 % IV SOLN: 500.0000 mL | Freq: Once | INTRAVENOUS | Status: DC

## 2020-05-12 NOTE — Patient Instructions (Signed)
Please read handouts provided. ?Continue present medications. ?Repeat colonoscopy in 10 years for screening. ? ? ?YOU HAD AN ENDOSCOPIC PROCEDURE TODAY AT THE [facility]:   Refer to the procedure report that was given to you for any specific questions about what was found during the examination.  If the procedure report does not answer your questions, please call your gastroenterologist to clarify.  If you requested that your care partner not be given the details of your procedure findings, then the procedure report has been included in a sealed envelope for you to review at your convenience later. ? ?YOU SHOULD EXPECT: Some feelings of bloating in the abdomen. Passage of more gas than usual.  Walking can help get rid of the air that was put into your GI tract during the procedure and reduce the bloating. If you had a lower endoscopy (such as a colonoscopy or flexible sigmoidoscopy) you may notice spotting of blood in your stool or on the toilet paper. If you underwent a bowel prep for your procedure, you may not have a normal bowel movement for a few days. ? ?Please Note:  You might notice some irritation and congestion in your nose or some drainage.  This is from the oxygen used during your procedure.  There is no need for concern and it should clear up in a day or so. ? ?SYMPTOMS TO REPORT IMMEDIATELY: ? ?Following lower endoscopy (colonoscopy or flexible sigmoidoscopy): ? Excessive amounts of blood in the stool ? Significant tenderness or worsening of abdominal pains ? Swelling of the abdomen that is new, acute ? Fever of 100?F or higher ? ? ?For urgent or emergent issues, a gastroenterologist can be reached at any hour by calling [phone]. ?Do not use MyChart messaging for urgent concerns.  ? ? ?DIET:  We do recommend a small meal at first, but then you may proceed to your regular diet.  Drink plenty of fluids but you should avoid alcoholic beverages for 24 hours. ? ?ACTIVITY:  You should  plan to take it easy for the rest of today and you should NOT DRIVE or use heavy machinery until tomorrow (because of the sedation medicines used during the test).   ? ?FOLLOW UP: ?Our staff will call the number listed on your records 48-72 hours following your procedure to check on you and address any questions or concerns that you may have regarding the information given to you following your procedure. If we do not reach you, we will leave a message.  We will attempt to reach you two times.  During this call, we will ask if you have developed any symptoms of COVID 19. If you develop any symptoms (ie: fever, flu-like symptoms, shortness of breath, cough etc.) before then, please call [phone].  If you test positive for Covid 19 in the 2 weeks post procedure, please call and report this information to us.   ? ?If any biopsies were taken you will be contacted by phone or by letter within the next 1-3 weeks.  Please call us at [phone] if you have not heard about the biopsies in 3 weeks.  ? ? ?SIGNATURES/CONFIDENTIALITY: ?You and/or your care partner have signed paperwork which will be entered into your electronic medical record.  These signatures attest to the fact that that the information above on your After Visit Summary has been reviewed and is understood.  Full responsibility of the confidentiality of this discharge information lies with you and/or your care-partner.  ?

## 2020-05-12 NOTE — Op Note (Signed)
[facility] Patient Name: Sherri Walton Procedure Date: 05/12/2020 10:03 AM MRN: [id number] Endoscopist: Viviann Spare P. Adela Lank , MD Age: 49 Referring MD:  Date of Birth: 1971/04/01 Gender: Female Account #: [id number] Procedure:                Colonoscopy Indications:              Screening for colorectal malignant neoplasm, This                            is the patient's first colonoscopy Medicines:                Monitored Anesthesia Care Procedure:                Pre-Anesthesia Assessment:                           - Prior to the procedure, a History and Physical                            was performed, and patient medications and                            allergies were reviewed. The patient's tolerance of                            previous anesthesia was also reviewed. The risks                            and benefits of the procedure and the sedation                            options and risks were discussed with the patient.                            All questions were answered, and informed consent                            was obtained. Prior Anticoagulants: The patient has                            taken no previous anticoagulant or antiplatelet                            agents. ASA Grade Assessment: II - A patient with                            mild systemic disease. After reviewing the risks                            and benefits, the patient was deemed in                            satisfactory condition to undergo the procedure.  After obtaining informed consent, the colonoscope                            was passed under direct vision. Throughout the                            procedure, the patient's blood pressure, pulse, and                            oxygen saturations were monitored continuously. The                            Colonoscope was introduced through the anus and                            advanced to the the  cecum, identified by                            appendiceal orifice and ileocecal valve. The                            colonoscopy was performed without difficulty. The                            patient tolerated the procedure well. The quality                            of the bowel preparation was good. The ileocecal                            valve, appendiceal orifice, and rectum were                            photographed. Scope In: 10:15:19 AM Scope Out: 10:34:06 AM Scope Withdrawal Time: 0 hours 16 minutes 6 seconds  Total Procedure Duration: 0 hours 18 minutes 47 seconds  Findings:                 The perianal and digital rectal examinations were                            normal.                           A single small angiodysplastic lesion was found in                            the cecum.                           A few small-mouthed diverticula were found in the                            sigmoid colon.  Anal papilla(e) were hypertrophied.                           Internal hemorrhoids were found during                            retroflexion. The hemorrhoids were small.                           The exam was otherwise without abnormality. Complications:            No immediate complications. Estimated blood loss:                            None. Estimated Blood Loss:     Estimated blood loss: none. Impression:               - A single colonic angiodysplastic lesion.                           - Diverticulosis in the sigmoid colon.                           - Anal papilla(e) were hypertrophied.                           - Internal hemorrhoids.                           - The examination was otherwise normal.                           - No specimens collected. Recommendation:           - Patient has a contact number available for                            emergencies. The signs and symptoms of potential                            delayed  complications were discussed with the                            patient. Return to normal activities tomorrow.                            Written discharge instructions were provided to the                            patient.                           - Resume previous diet.                           - Continue present medications.                           -  Repeat colonoscopy in 10 years for screening                            purposes. Viviann Spare P. Wofford Stratton, MD 05/12/2020 10:37:25 AM This report has been signed electronically.

## 2020-05-12 NOTE — Progress Notes (Signed)
PT taken to PACU. Monitors in place. VSS. Report given to RN. 

## 2020-05-14 ENCOUNTER — Telehealth: Payer: Self-pay

## 2020-05-14 NOTE — Telephone Encounter (Signed)
  Follow up Call-  Call back number 05/12/2020  Post procedure Call Back phone  # [phone]  Permission to leave phone message Yes  Some recent data might be hidden     Patient questions:  Do you have a fever, pain , or abdominal swelling? No. Pain Score  0 *  Have you tolerated food without any problems? Yes.    Have you been able to return to your normal activities? Yes.    Do you have any questions about your discharge instructions: Diet   No. Medications  No. Follow up visit  No.  Do you have questions or concerns about your Care? No.  Actions: * If pain score is 4 or above: No action needed, pain <4.  1. Have you developed a fever since your procedure? no  2.   Have you had an respiratory symptoms (SOB or cough) since your procedure? no  3.   Have you tested positive for COVID 19 since your procedure no  4.   Have you had any family members/close contacts diagnosed with the COVID 19 since your procedure?  no   If yes to any of these questions please route to Laverna Peace, RN and Karlton Lemon, RN

## 2020-05-17 ENCOUNTER — Ambulatory Visit: Payer: BC Managed Care – PPO | Admitting: Podiatry

## 2020-07-14 ENCOUNTER — Encounter: Payer: Self-pay | Admitting: Podiatry

## 2020-07-14 ENCOUNTER — Other Ambulatory Visit: Payer: Self-pay

## 2020-07-14 ENCOUNTER — Ambulatory Visit (INDEPENDENT_AMBULATORY_CARE_PROVIDER_SITE_OTHER): Payer: BC Managed Care – PPO | Admitting: Podiatry

## 2020-07-14 DIAGNOSIS — M7671 Peroneal tendinitis, right leg: Secondary | ICD-10-CM | POA: Diagnosis not present

## 2020-07-14 DIAGNOSIS — M7672 Peroneal tendinitis, left leg: Secondary | ICD-10-CM | POA: Diagnosis not present

## 2020-07-14 DIAGNOSIS — M7670 Peroneal tendinitis, unspecified leg: Secondary | ICD-10-CM

## 2020-07-14 NOTE — Progress Notes (Signed)
Subjective:   Patient ID: Sherri Walton, female   DOB: 49 y.o.   MRN: [id number]   HPI Patient presents stating that the ankles are feeling pretty good but there is a lot of pain in the outside of the ankle of both feet and she feels like it is gotten swollen   ROS      Objective:  Physical Exam  Neurovascular status intact with inflammation more along the peroneal course as it comes behind the lateral malleolus bilateral with improvement of the sinus tarsi     Assessment:  Peroneal tendinitis bilateral with improved sinus tarsi     Plan:  Reviewed peroneal tendinitis and will get it treated conservatively and I did sterile prep and injected around the tendon complex 3 mg dexamethasone Kenalog 5 mg liken bilateral.  Discussed that if this persist will need to consider MRIs or other treatment

## 2020-07-19 ENCOUNTER — Telehealth: Payer: Self-pay | Admitting: Podiatry

## 2020-07-19 NOTE — Telephone Encounter (Signed)
Patient would like to have MRI done, to find out why her ankles keep swelling.

## 2020-07-19 NOTE — Telephone Encounter (Signed)
We would need to see her next week to discuss and order the mri

## 2020-08-24 ENCOUNTER — Other Ambulatory Visit: Payer: Self-pay

## 2020-08-24 ENCOUNTER — Other Ambulatory Visit: Payer: BC Managed Care – PPO

## 2020-08-24 ENCOUNTER — Emergency Department (HOSPITAL_COMMUNITY)
Admission: EM | Admit: 2020-08-24 | Discharge: 2020-08-24 | Disposition: A | Discharge status: Home / Self Care | Payer: BC Managed Care – PPO | Attending: Emergency Medicine | Admitting: Emergency Medicine

## 2020-08-24 DIAGNOSIS — Z20822 Contact with and (suspected) exposure to covid-19: Secondary | ICD-10-CM | POA: Insufficient documentation

## 2020-08-24 LAB — RESP PANEL BY RT-PCR (RSV, FLU A&B, COVID)  RVPGX2
Influenza A by PCR: NEGATIVE
Influenza B by PCR: NEGATIVE
Resp Syncytial Virus by PCR: NEGATIVE
SARS Coronavirus 2 by RT PCR: NEGATIVE

## 2020-08-24 NOTE — Discharge Instructions (Addendum)
If you develop chest pain, difficulty in breathing, fever or other new concerns symptom, recommend follow-up return to ER for reassessment.

## 2020-08-24 NOTE — ED Triage Notes (Signed)
Pt presents with c/o covid exposure. Pt reports that she was exposed to someone with covid.

## 2020-08-25 NOTE — ED Provider Notes (Signed)
[facility]-EMERGENCY DEPT Provider Note   CSN: [id number] Arrival date & time: 08/24/20  9323     History Chief Complaint  Patient presents with  . Covid Exposure    Sherri Walton is a 49 y.o. female.  Presents to ER with concern for Covid test.  States that someone in her household had a cough and she is worried that it could be Covid.  Has not had any known Covid exposures.  She has no symptoms.  HPI     Past Medical History:  Diagnosis Date  . Allergy    seasonal allergies  . HLD (hyperlipidemia)    diet controlled    Patient Active Problem List   Diagnosis Date Noted  . Hepatitis B immune 08/26/2012  . DERMATOGRAPHIA 04/18/2010    Past Surgical History:  Procedure Laterality Date  . CESAREAN SECTION  1991  . TUBAL LIGATION  2001  . wisdom teeth  1989     OB History   No obstetric history on file.     Family History  Problem Relation Age of Onset  . Hypertension Mother   . Diabetes Mother   . Gout Mother   . Liver cancer Father   . Alcohol abuse Father   . Heart disease Brother   . Alcohol abuse Paternal Aunt   . Breast cancer Other   . Colon polyps Neg Hx   . Colon cancer Neg Hx   . Esophageal cancer Neg Hx   . Stomach cancer Neg Hx   . Rectal cancer Neg Hx     Social History   Tobacco Use  . Smoking status: Never Smoker  . Smokeless tobacco: Never Used  Vaping Use  . Vaping Use: Never used  Substance Use Topics  . Alcohol use: Yes    Comment: special occassions  . Drug use: No    Home Medications Prior to Admission medications   Medication Sig Start Date End Date Taking? Authorizing Provider  albuterol (PROVENTIL HFA;VENTOLIN HFA) 108 (90 Base) MCG/ACT inhaler Inhale 1-2 puffs into the lungs every 6 (six) hours as needed for wheezing or shortness of breath. Patient not taking: Reported on 04/28/2020 09/28/16   Bethel Born, PA-C  EPINEPHrine 0.3 mg/0.3 mL IJ SOAJ injection Self inject per package  instructions as needed for severe allergic reaction and seek medical attention. Patient not taking: Reported on 04/28/2020 06/23/14   Molpus, Jonny Ruiz, MD  fluticasone [facility]) 50 MCG/ACT nasal spray Place 1 spray into both nostrils daily as needed for allergies or rhinitis. Patient not taking: Reported on 05/12/2020    [provider]  ibuprofen (ADVIL,MOTRIN) 200 MG tablet Take 200 mg by mouth every 6 (six) hours as needed for headache, mild pain or cramping.  Patient not taking: Reported on 05/12/2020    [provider]  loratadine (CLARITIN) 10 MG tablet Take 10 mg by mouth daily as needed for allergies.  Patient not taking: Reported on 05/12/2020 06/22/17   [provider]    Allergies    Minocycline, Nitrofurantoin, and Ketorolac tromethamine  Review of Systems   Review of Systems  Constitutional: Negative for chills and fever.  HENT: Negative for ear pain and sore throat.   Eyes: Negative for pain and visual disturbance.  Respiratory: Negative for cough and shortness of breath.   Cardiovascular: Negative for chest pain and palpitations.  Gastrointestinal: Negative for abdominal pain and vomiting.  Genitourinary: Negative for dysuria and hematuria.  Musculoskeletal: Negative for arthralgias and back  pain.  Skin: Negative for color change and rash.  Neurological: Negative for seizures and syncope.  All other systems reviewed and are negative.   Physical Exam Updated Vital Signs BP 125/80 (BP Location: Right Arm)   Pulse 80   Temp 98 F (36.7 C) (Oral)   Resp 16   SpO2 99%   Physical Exam Vitals and nursing note reviewed.  Constitutional:      General: She is not in acute distress.    Appearance: She is well-developed and well-nourished.  HENT:     Head: Normocephalic and atraumatic.  Eyes:     Conjunctiva/sclera: Conjunctivae normal.  Cardiovascular:     Rate and Rhythm: Normal rate and regular rhythm.     Heart sounds: No murmur  heard.   Pulmonary:     Effort: Pulmonary effort is normal. No respiratory distress.     Breath sounds: Normal breath sounds.  Abdominal:     Palpations: Abdomen is soft.     Tenderness: There is no abdominal tenderness.  Musculoskeletal:        General: No edema.     Cervical back: Neck supple.  Skin:    General: Skin is warm and dry.  Neurological:     Mental Status: She is alert.  Psychiatric:        Mood and Affect: Mood and affect normal.     ED Results / Procedures / Treatments   Labs (all labs ordered are listed, but only abnormal results are displayed) Labs Reviewed  RESP PANEL BY RT-PCR (RSV, FLU A&B, COVID)  [id number]    EKG None  Radiology No results found.  Procedures Procedures (including critical care time)  Medications Ordered in ED Medications - No data to display  ED Course  I have reviewed the triage vital signs and the nursing notes.  Pertinent labs & imaging results that were available during my care of the patient were reviewed by me and considered in my medical decision making (see chart for details).    MDM Rules/Calculators/A&P                         49 year old lady presented to ER with request for Covid test.  She has a completely negative review of systems.  No symptoms.  Physical exam reassuring.  Vitals normal.  Will send Covid test and discharged home.  After the discussed management above, the patient was determined to be safe for discharge.  The patient was in agreement with this plan and all questions regarding their care were answered.  ED return precautions were discussed and the patient will return to the ED with any significant worsening of condition.   Final Clinical Impression(s) / ED Diagnoses Final diagnoses:  Encounter for laboratory testing for COVID-19 virus    Rx / DC Orders ED Discharge Orders    None       Milagros Loll, MD 08/26/20 [phone]

## 2020-09-08 ENCOUNTER — Other Ambulatory Visit: Payer: Self-pay | Admitting: Podiatry

## 2020-09-08 ENCOUNTER — Telehealth: Payer: Self-pay | Admitting: Podiatry

## 2020-09-08 DIAGNOSIS — T148XXA Other injury of unspecified body region, initial encounter: Secondary | ICD-10-CM

## 2020-09-08 NOTE — Telephone Encounter (Signed)
I ordered for the right. Should give [place] information we need

## 2020-09-08 NOTE — Telephone Encounter (Signed)
Patient called back and stated that she would need an MRI for both ankles. She is at home today due to the swelling. Can you send referral for the left ankle as well.

## 2020-09-08 NOTE — Telephone Encounter (Signed)
Patient called stating she was told during last visit if the injections didn't help then she could be sent for MRI, Patient has request order for an MRI, Please Advise

## 2020-09-20 NOTE — Telephone Encounter (Signed)
Should have already been sent in

## 2020-09-24 ENCOUNTER — Inpatient Hospital Stay: Admission: RE | Admit: 2020-09-24 | Payer: BC Managed Care – PPO | Source: Ambulatory Visit

## 2020-09-26 ENCOUNTER — Inpatient Hospital Stay: Admission: RE | Admit: 2020-09-26 | Payer: BC Managed Care – PPO | Source: Ambulatory Visit

## 2020-10-08 ENCOUNTER — Other Ambulatory Visit: Payer: Self-pay | Admitting: Podiatry

## 2020-10-08 ENCOUNTER — Ambulatory Visit
Admission: RE | Admit: 2020-10-08 | Discharge: 2020-10-08 | Disposition: A | Discharge status: Home / Self Care | Payer: BC Managed Care – PPO | Source: Ambulatory Visit | Attending: Podiatry | Admitting: Podiatry

## 2020-10-08 ENCOUNTER — Other Ambulatory Visit: Payer: Self-pay

## 2020-10-08 DIAGNOSIS — T148XXA Other injury of unspecified body region, initial encounter: Secondary | ICD-10-CM

## 2020-10-08 MED ORDER — GADOBENATE DIMEGLUMINE 529 MG/ML IV SOLN
20.0000 mL | Freq: Once | INTRAVENOUS | Status: AC | PRN
  Administered 2020-10-08: 20 mL via INTRAVENOUS

## 2020-10-13 ENCOUNTER — Other Ambulatory Visit: Payer: BC Managed Care – PPO

## 2020-10-27 ENCOUNTER — Ambulatory Visit (INDEPENDENT_AMBULATORY_CARE_PROVIDER_SITE_OTHER): Payer: BC Managed Care – PPO | Admitting: Podiatry

## 2020-10-27 ENCOUNTER — Other Ambulatory Visit: Payer: Self-pay

## 2020-10-27 ENCOUNTER — Encounter: Payer: Self-pay | Admitting: Podiatry

## 2020-10-27 ENCOUNTER — Other Ambulatory Visit: Payer: Self-pay | Admitting: Family Medicine

## 2020-10-27 DIAGNOSIS — M779 Enthesopathy, unspecified: Secondary | ICD-10-CM

## 2020-10-27 DIAGNOSIS — Z1231 Encounter for screening mammogram for malignant neoplasm of breast: Secondary | ICD-10-CM

## 2020-10-27 DIAGNOSIS — M7670 Peroneal tendinitis, unspecified leg: Secondary | ICD-10-CM

## 2020-10-27 DIAGNOSIS — R600 Localized edema: Secondary | ICD-10-CM

## 2020-10-27 MED ORDER — TRIAMCINOLONE ACETONIDE 10 MG/ML IJ SUSP
10.0000 mg | Freq: Once | INTRAMUSCULAR | Status: AC
  Administered 2020-10-27: 10 mg

## 2020-10-27 MED ORDER — PREDNISONE 10 MG PO TABS: ORAL_TABLET | ORAL | 0 refills | Status: DC

## 2020-10-27 NOTE — Progress Notes (Signed)
Subjective:   Patient ID: Sherri Walton, female   DOB: 50 y.o.   MRN: [id number]   HPI Patient states both feet are swollen and she has pain still and is not able to work 12-hour shifts and needs to sit   ROS      Objective:  Physical Exam  Letter status intact with patient found to have inflammation pain mostly the sinus tarsi now with fluid buildup moderate swelling negative and sign noted with the pain extending into the ankle mid arch and states the compression stocking right has been helpful     Assessment:  Difficult to say as to whether or not this may be a vein problem with inflammatory capsulitis of the sinus tarsi     Plan:  H&P and discussed condition and went ahead did sterile prep injected the sinus tarsi bilateral 3 mg Kenalog 5 g liken placed on Sterapred DS Dosepak to try to reduce the inflammatory component and we will try to get her referred for a vein consult

## 2020-10-29 NOTE — Addendum Note (Signed)
Addended by: Lenn Sink on: 10/29/2020 10:46 AM   Modules accepted: Orders

## 2020-11-02 ENCOUNTER — Telehealth: Payer: Self-pay | Admitting: *Deleted

## 2020-11-02 ENCOUNTER — Other Ambulatory Visit: Payer: Self-pay

## 2020-11-02 ENCOUNTER — Ambulatory Visit (HOSPITAL_COMMUNITY)
Admission: RE | Admit: 2020-11-02 | Discharge: 2020-11-02 | Disposition: A | Discharge status: Home / Self Care | Payer: BC Managed Care – PPO | Source: Ambulatory Visit | Attending: Podiatry | Admitting: Podiatry

## 2020-11-02 DIAGNOSIS — R600 Localized edema: Secondary | ICD-10-CM | POA: Insufficient documentation

## 2020-11-02 NOTE — Telephone Encounter (Signed)
Vascular /Vein is calling to inform physician that patient's study B/ L to r/o DVT findings are negative.

## 2020-11-19 ENCOUNTER — Ambulatory Visit: Payer: BC Managed Care – PPO | Admitting: Podiatry

## 2020-12-08 ENCOUNTER — Other Ambulatory Visit: Payer: Self-pay

## 2020-12-08 ENCOUNTER — Encounter: Payer: Self-pay | Admitting: Podiatry

## 2020-12-08 ENCOUNTER — Ambulatory Visit (INDEPENDENT_AMBULATORY_CARE_PROVIDER_SITE_OTHER): Payer: BC Managed Care – PPO | Admitting: Podiatry

## 2020-12-08 DIAGNOSIS — M7670 Peroneal tendinitis, unspecified leg: Secondary | ICD-10-CM

## 2020-12-08 DIAGNOSIS — M779 Enthesopathy, unspecified: Secondary | ICD-10-CM | POA: Diagnosis not present

## 2020-12-08 DIAGNOSIS — R600 Localized edema: Secondary | ICD-10-CM

## 2020-12-08 MED ORDER — DICLOFENAC SODIUM 75 MG PO TBEC: 75.0000 mg | DELAYED_RELEASE_TABLET | Freq: Two times a day (BID) | ORAL | 2 refills | Status: DC

## 2020-12-08 NOTE — Progress Notes (Signed)
Subjective:   Patient ID: Sherri Walton, female   DOB: 50 y.o.   MRN: [id number]   HPI Patient presents stating she still getting swelling in both of her feet and the injections only helped temporarily as the steroid only helped temporarily   ROS      Objective:  Physical Exam  Neurovascular status intact with patient's feet showing mild swelling in the lateral side but localized with discomfort and vein testing which apparently showed issues but she is to work with her family doctor     Assessment:  Most likely dealing with venous disease but no indications of clots     Plan:  I reviewed this with her recommended that she work with her family physician and I do not see what else I can do except advised on compression elevation and I did placed on oral anti-inflammatory diclofenac today to try to help with inflammation

## 2020-12-15 ENCOUNTER — Telehealth: Payer: Self-pay | Admitting: Podiatry

## 2020-12-15 NOTE — Telephone Encounter (Signed)
Gave pt benefit information and she is wanting to proceed with the orthotics. Covered if billed with office visit and pt pays copay.

## 2020-12-15 NOTE — Telephone Encounter (Signed)
Go ahead and send out for orthotics

## 2021-01-04 ENCOUNTER — Telehealth: Payer: Self-pay | Admitting: Podiatry

## 2021-01-04 ENCOUNTER — Other Ambulatory Visit: Payer: Self-pay | Admitting: Family Medicine

## 2021-01-04 DIAGNOSIS — N644 Mastodynia: Secondary | ICD-10-CM

## 2021-01-04 NOTE — Telephone Encounter (Signed)
Patient would like to extend intermittent leave, Please advise?

## 2021-01-05 ENCOUNTER — Telehealth: Payer: Self-pay | Admitting: Podiatry

## 2021-01-05 NOTE — Telephone Encounter (Signed)
Orthotics in... lvm for pt ok to pick up... 

## 2021-01-05 NOTE — Telephone Encounter (Signed)
Pt has had orthotics previously and is aware of how to wear them and will pick up in  office end of next week or the following week.

## 2021-02-07 ENCOUNTER — Ambulatory Visit: Payer: BC Managed Care – PPO

## 2021-02-17 ENCOUNTER — Other Ambulatory Visit: Payer: Self-pay

## 2021-02-17 ENCOUNTER — Ambulatory Visit
Admission: RE | Admit: 2021-02-17 | Discharge: 2021-02-17 | Disposition: A | Discharge status: Home / Self Care | Payer: BC Managed Care – PPO | Source: Ambulatory Visit | Attending: Family Medicine | Admitting: Family Medicine

## 2021-02-17 DIAGNOSIS — N644 Mastodynia: Secondary | ICD-10-CM

## 2021-09-06 IMAGING — MR MR ANKLE*R* WO/W CM
4 of 8 series · 13 of 40 positions shown · IV contrast (multihance)
Comparison: None.

CLINICAL DATA: Chronic foot pain.

EXAM:
MRI OF THE RIGHT ANKLE WITHOUT AND WITH CONTRAST
TECHNIQUE: Multiplanar, multisequence MR imaging of the ankle was performed
before and after the administration of intravenous contrast.
CONTRAST:  20mL MULTIHANCE GADOBENATE DIMEGLUMINE 529 MG/ML IV SOLN

[Series 3: PD fat-sat · axial · right · 3.0mm · 0.25mm/px · z∈[-130,-6]mm · 4 of 32 slices shown]
[im 1/32]
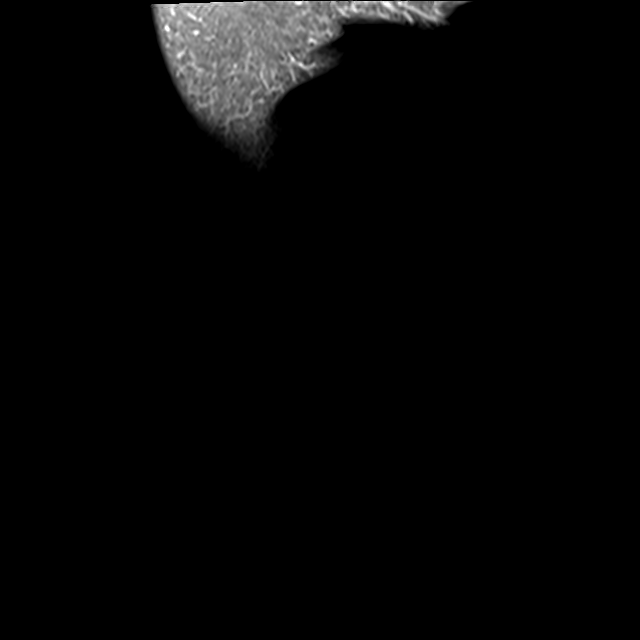
[im 8/32]
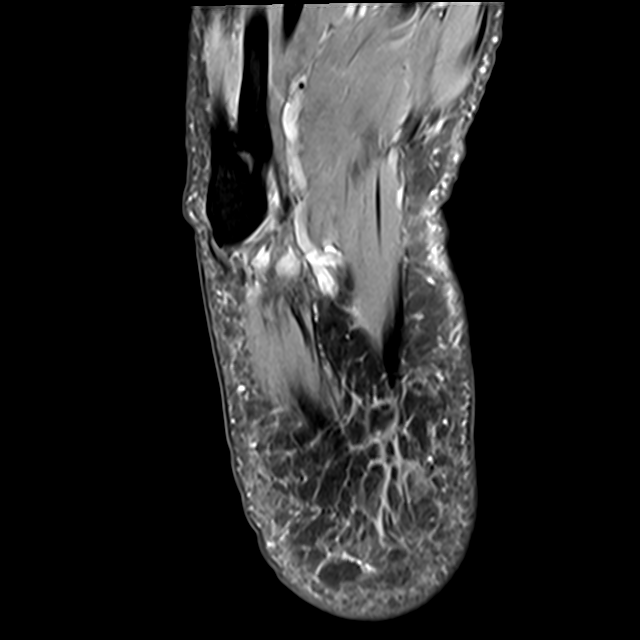
[im 16/32]
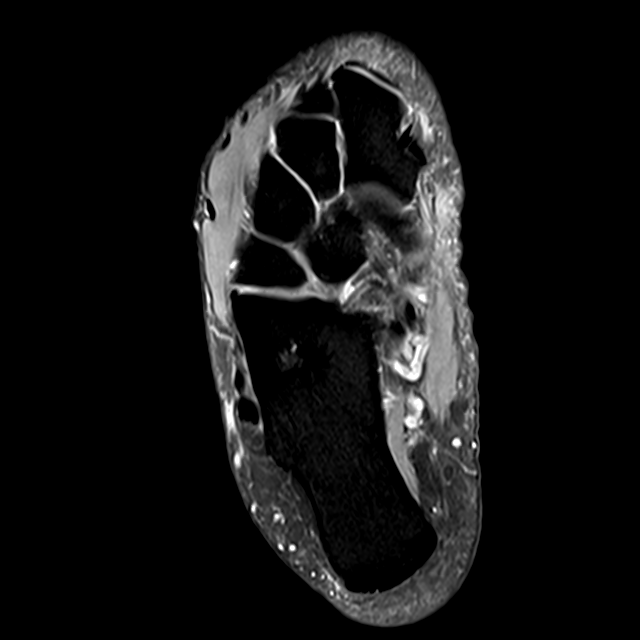
[im 32/32]
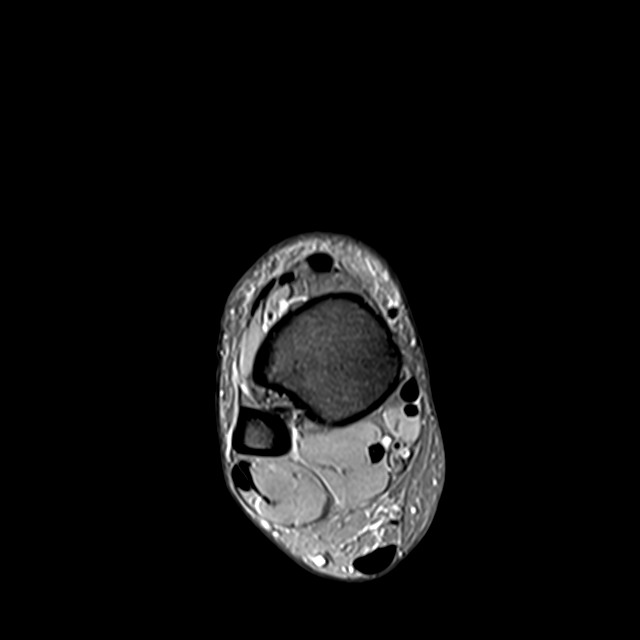

[Series 4: T2 fat-sat · axial · right · 3.0mm · 0.25mm/px · z∈[-130,-6]mm · 3 of 32 slices shown (1 of 2)]
[im 1/32]
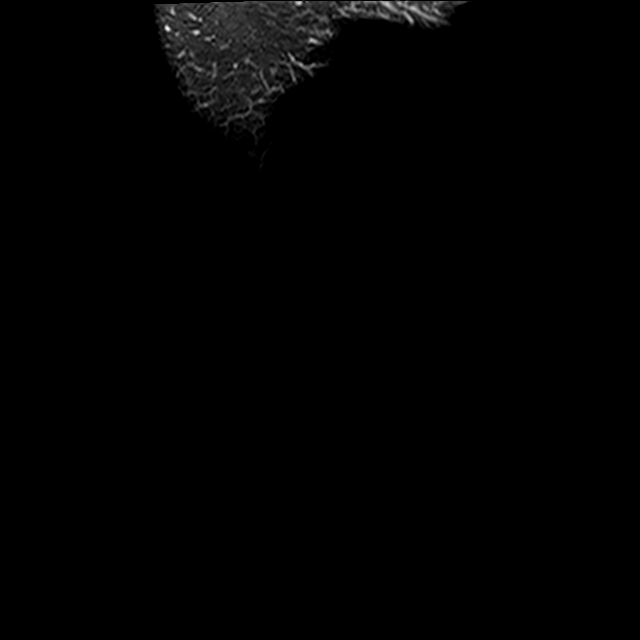
[im 16/32]
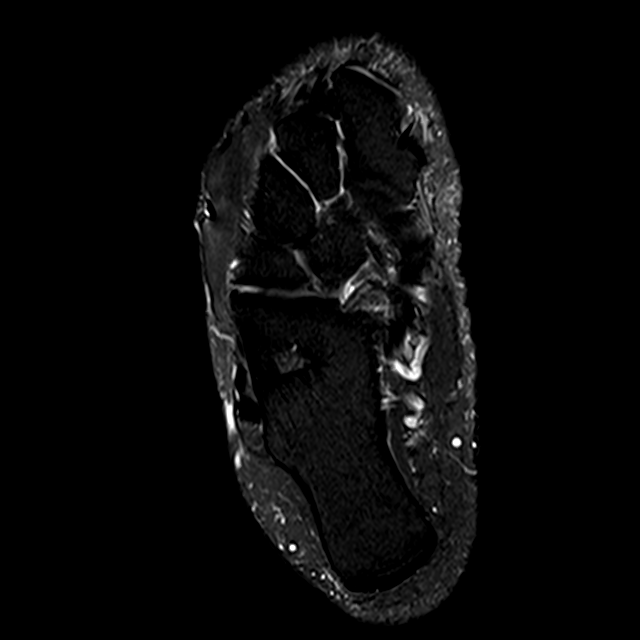
[im 32/32]
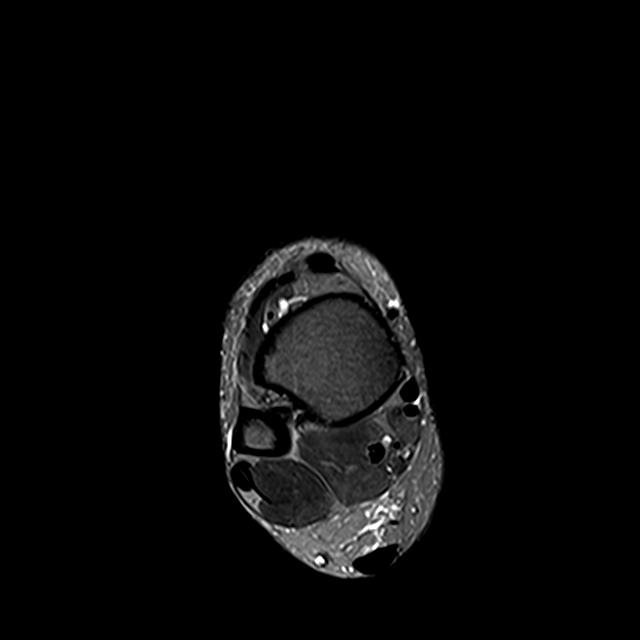

[Series 5: T1 · sagittal · right · 4.0mm · 0.27mm/px · 3 of 24 slices shown]
[im 1/24]
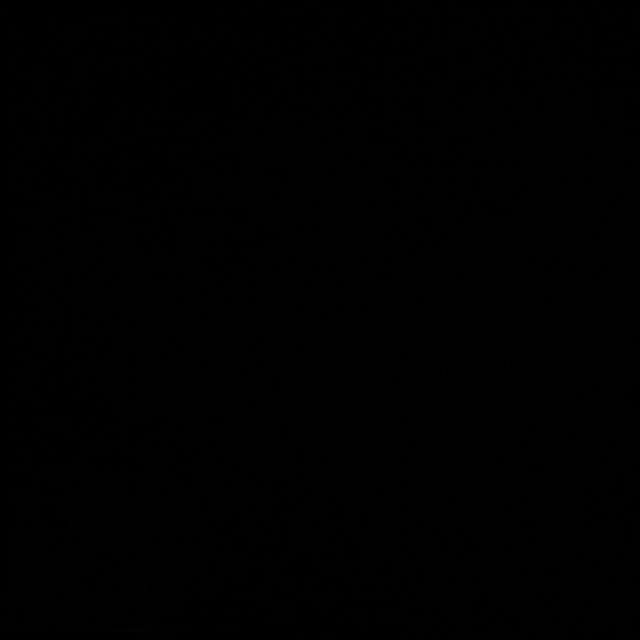
[im 16/24]
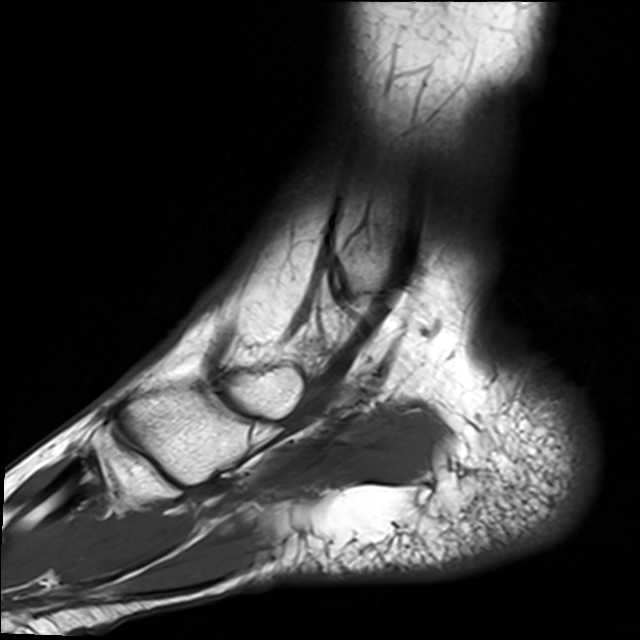
[im 24/24]
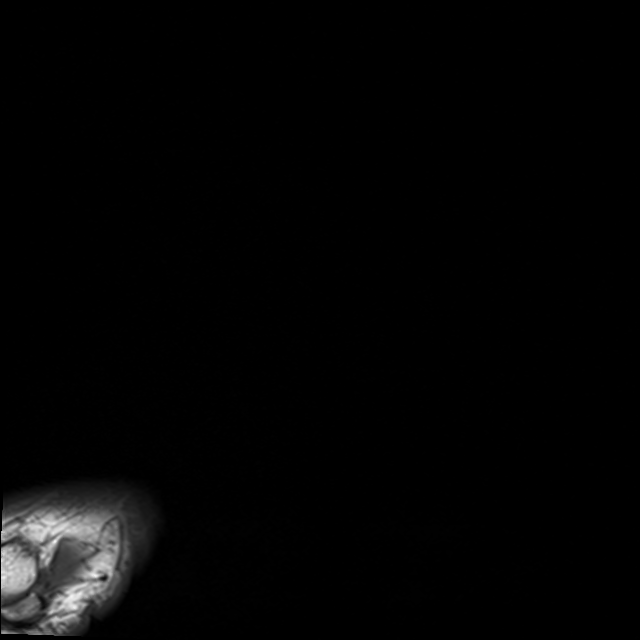

[Series 7: T2 fat-sat · coronal · right · 3.0mm · 0.25mm/px · 3 of 37 slices shown (2 of 2)]
[im 8/37]
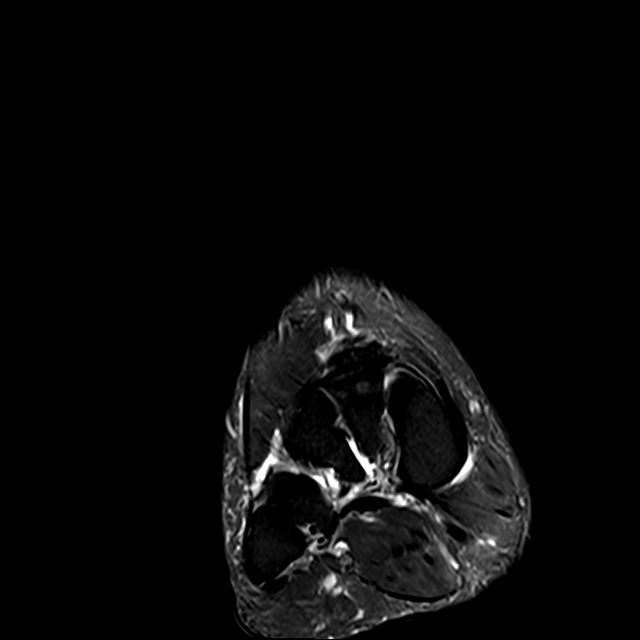
[im 22/37]
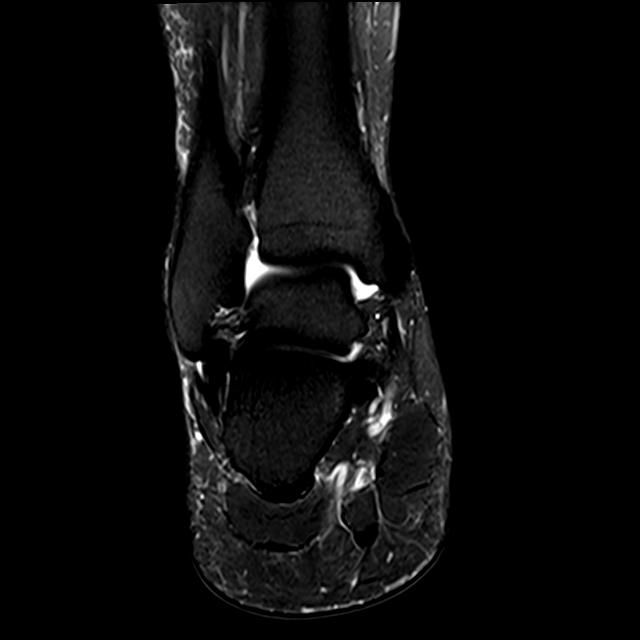
[im 37/37]
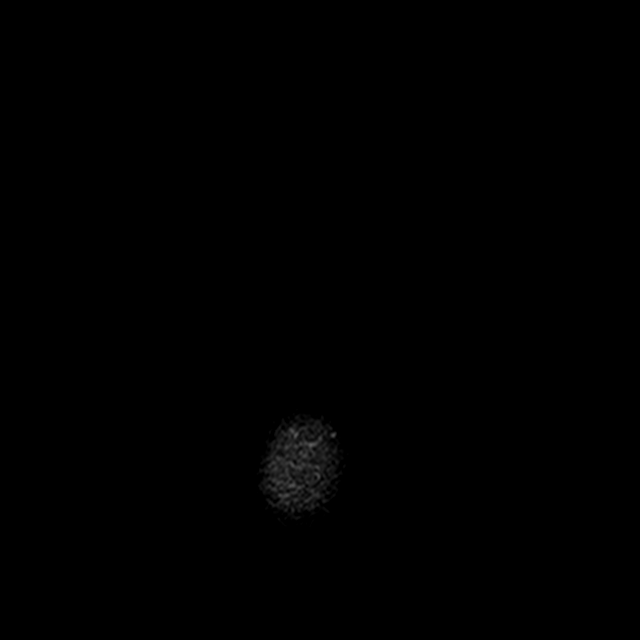

[13 of 40 positions shown; findings below may reference images not displayed]

FINDINGS: TENDONS

Peroneal: Intact. No tendinopathy. Moderate tenosynovitis involving
the mid distal portion of the peroneus longus.

Posteromedial: Intact. No tendinopathy. Mild tenosynovitis involving
the posterior tibialis tendon and also the flexor digitorum longus
tendon.

Anterior: Intact.  No tendinopathy or tenosynovitis.

Achilles: Intact.  Minimal distal tendinopathy.

Plantar Fascia: Intact.  No findings for plantar fasciitis.

LIGAMENTS

Lateral: Intact

Medial: Intact

CARTILAGE

Ankle Joint: Small joint effusion. The articular cartilage is
intact. No cartilage defects or osteochondral lesion.

Subtalar Joints/Sinus Tarsi: Joint small posterior facet effusion
but the articular cartilage appears intact. Sinus tarsi is
unremarkable. The cervical and interosseous ligaments are intact.
The spring ligament is intact.

Talonavicular joint effusion and ganglion cyst noted anteriorly
adjacent to the anterior tibialis tendon. This measures 16.5 mm.

Bones: No bone contusion, marrow edema or osteochondral lesion. No
proximal metacarpal stress fracture.

Other: Unremarkable foot and ankle musculature.
IMPRESSION: IMPRESSION
1. Moderate tenosynovitis involving the mid distal portion of the
peroneus longus tendon. No tendinopathy.
2. Mild tenosynovitis involving the posterior tibialis tendon and
also the flexor digitorum longus tendon.
3. Intact medial and lateral ankle ligaments.
4. Small tibiotalar and subtalar joint effusions.
5. 16.5 mm ganglion cyst likely emanating from the talonavicular
joint.
6. No stress fracture or bone lesions.

## 2022-02-06 ENCOUNTER — Other Ambulatory Visit: Payer: Self-pay | Admitting: Family Medicine

## 2022-02-06 DIAGNOSIS — Z1231 Encounter for screening mammogram for malignant neoplasm of breast: Secondary | ICD-10-CM

## 2022-02-23 ENCOUNTER — Ambulatory Visit
Admission: RE | Admit: 2022-02-23 | Discharge: 2022-02-23 | Disposition: A | Discharge status: Home / Self Care | Payer: Medicaid Other | Source: Ambulatory Visit | Attending: Family Medicine | Admitting: Family Medicine

## 2022-02-23 DIAGNOSIS — Z1231 Encounter for screening mammogram for malignant neoplasm of breast: Secondary | ICD-10-CM

## 2022-02-24 ENCOUNTER — Ambulatory Visit: Payer: BC Managed Care – PPO

## 2022-08-28 DIAGNOSIS — J189 Pneumonia, unspecified organism: Secondary | ICD-10-CM

## 2022-08-28 DIAGNOSIS — M1990 Unspecified osteoarthritis, unspecified site: Secondary | ICD-10-CM

## 2022-08-28 HISTORY — DX: Unspecified osteoarthritis, unspecified site: M19.90

## 2022-08-28 HISTORY — DX: Pneumonia, unspecified organism: J18.9

## 2022-09-15 ENCOUNTER — Ambulatory Visit: Payer: Medicaid Other | Admitting: Podiatry

## 2022-09-18 ENCOUNTER — Ambulatory Visit: Payer: Medicaid Other | Admitting: Podiatry

## 2022-12-08 ENCOUNTER — Other Ambulatory Visit: Payer: Self-pay | Admitting: Family Medicine

## 2022-12-08 DIAGNOSIS — N6320 Unspecified lump in the left breast, unspecified quadrant: Secondary | ICD-10-CM

## 2022-12-13 ENCOUNTER — Other Ambulatory Visit: Payer: Self-pay | Admitting: Family Medicine

## 2022-12-13 ENCOUNTER — Ambulatory Visit
Admission: RE | Admit: 2022-12-13 | Discharge: 2022-12-13 | Disposition: A | Discharge status: Home / Self Care | Payer: Medicaid Other | Source: Ambulatory Visit | Attending: Family Medicine | Admitting: Family Medicine

## 2022-12-13 DIAGNOSIS — N6320 Unspecified lump in the left breast, unspecified quadrant: Secondary | ICD-10-CM

## 2022-12-15 ENCOUNTER — Ambulatory Visit
Admission: RE | Admit: 2022-12-15 | Discharge: 2022-12-15 | Disposition: A | Discharge status: Home / Self Care | Payer: Medicaid Other | Source: Ambulatory Visit | Attending: Family Medicine | Admitting: Family Medicine

## 2022-12-15 DIAGNOSIS — N6320 Unspecified lump in the left breast, unspecified quadrant: Secondary | ICD-10-CM

## 2022-12-15 HISTORY — PX: BREAST BIOPSY: SHX20

## 2023-01-29 ENCOUNTER — Encounter: Payer: Self-pay | Admitting: Family Medicine

## 2023-03-15 ENCOUNTER — Other Ambulatory Visit: Payer: Self-pay | Admitting: Family Medicine

## 2023-03-15 ENCOUNTER — Ambulatory Visit
Admission: RE | Admit: 2023-03-15 | Discharge: 2023-03-15 | Disposition: A | Discharge status: Home / Self Care | Payer: Medicaid Other | Source: Ambulatory Visit | Attending: Family Medicine | Admitting: Family Medicine

## 2023-03-15 ENCOUNTER — Ambulatory Visit: Payer: Medicaid Other

## 2023-03-15 DIAGNOSIS — N6320 Unspecified lump in the left breast, unspecified quadrant: Secondary | ICD-10-CM

## 2023-05-02 ENCOUNTER — Encounter: Payer: Self-pay | Admitting: *Deleted

## 2023-05-02 ENCOUNTER — Ambulatory Visit
Admission: EM | Admit: 2023-05-02 | Discharge: 2023-05-02 | Disposition: A | Discharge status: Home / Self Care | Payer: Medicaid Other | Attending: Family Medicine | Admitting: Family Medicine

## 2023-05-02 ENCOUNTER — Other Ambulatory Visit: Payer: Self-pay

## 2023-05-02 DIAGNOSIS — M26621 Arthralgia of right temporomandibular joint: Secondary | ICD-10-CM

## 2023-05-02 DIAGNOSIS — R519 Headache, unspecified: Secondary | ICD-10-CM

## 2023-05-02 MED ORDER — PREDNISONE 50 MG PO TABS: ORAL_TABLET | ORAL | 0 refills | Status: DC

## 2023-05-02 MED ORDER — TRAMADOL HCL 50 MG PO TABS: 50.0000 mg | ORAL_TABLET | Freq: Four times a day (QID) | ORAL | 0 refills | Status: DC | PRN

## 2023-05-02 NOTE — Discharge Instructions (Signed)
Soft foods Warmth to area.  Heating pad on low heat only Take prednisone once a day for 5 days Take tramadol if needed for pain in addition.  Do not work on tramadol or drive

## 2023-05-02 NOTE — ED Provider Notes (Signed)
[facility]    CSN: [id number] Arrival date & time: 05/02/23  1622      History   Chief Complaint Chief Complaint  Patient presents with   Facial Pain    HPI Sherri Walton is a 52 y.o. female.   Patient was at work today and developed pain on the right side of her face.  No accident or injury.  That happened after lunch.  It is an achy pain that is constant.  It hurts when she opens and closes.  No specific pain into the gums.  She has never had this pain before.  She did have a Bell's palsy over 20 years ago and is worried, although she has no numbness or weakness.    Past Medical History:  Diagnosis Date   Allergy    seasonal allergies   HLD (hyperlipidemia)    diet controlled    Patient Active Problem List   Diagnosis Date Noted   Hepatitis B immune 08/26/2012   DERMATOGRAPHIA 04/18/2010    Past Surgical History:  Procedure Laterality Date   BREAST BIOPSY Left 12/15/2022   [place] LT BREAST BX W LOC DEV 1ST LESION IMG BX SPEC US GUIDE 12/15/2022 [facility]   CESAREAN SECTION  1991   TUBAL LIGATION  2001   wisdom teeth  1989    OB History   No obstetric history on file.      Home Medications    Prior to Admission medications   Medication Sig Start Date End Date Taking? Authorizing Provider  fexofenadine (ALLEGRA) 180 MG tablet Take 180 mg by mouth daily. 03/21/23  Yes [provider]  predniSONE (DELTASONE) 50 MG tablet Take once a day for 5 days.  Take with food 05/02/23  Yes Eustace Moore, MD  traMADol (ULTRAM) 50 MG tablet Take 1 tablet (50 mg total) by mouth every 6 (six) hours as needed. 05/02/23  Yes Eustace Moore, MD    Family History Family History  Problem Relation Age of Onset   Hypertension Mother    Diabetes Mother    Gout Mother    Liver cancer Father    Alcohol abuse Father    Heart disease Brother    Alcohol abuse Paternal Aunt    Breast cancer Other    Colon polyps Neg Hx    Colon cancer Neg Hx     Esophageal cancer Neg Hx    Stomach cancer Neg Hx    Rectal cancer Neg Hx     Social History Social History   Tobacco Use   Smoking status: Never   Smokeless tobacco: Never  Vaping Use   Vaping status: Never Used  Substance Use Topics   Alcohol use: Yes    Comment: special occassions   Drug use: No     Allergies   Minocycline, Nitrofurantoin, and Ketorolac tromethamine   Review of Systems Review of Systems See HPI  Physical Exam Triage Vital Signs ED Triage Vitals  Encounter Vitals Group     BP 05/02/23 1633 119/81     Systolic BP Percentile --      Diastolic BP Percentile --      Pulse Rate 05/02/23 1633 78     Resp 05/02/23 1633 18     Temp 05/02/23 1633 97.6 F (36.4 C)     Temp Source 05/02/23 1633 Oral     SpO2 05/02/23 1633 97 %     Weight --      Height --  Head Circumference --      Peak Flow --      Pain Score 05/02/23 1637 7     Pain Loc --      Pain Education --      Exclude from Growth Chart --    No data found.  Updated Vital Signs BP 119/81 (BP Location: Right Arm)   Pulse 78   Temp 97.6 F (36.4 C) (Oral)   Resp 18   SpO2 97%      Physical Exam Constitutional:      General: She is not in acute distress.    Appearance: She is well-developed and normal weight.  HENT:     Head: Normocephalic and atraumatic.     Right Ear: Tympanic membrane and ear canal normal.     Left Ear: Tympanic membrane and ear canal normal.     Nose: Nose normal. No rhinorrhea.     Mouth/Throat:     Mouth: Mucous membranes are moist.     Pharynx: Oropharynx is clear.     Comments: No erythema alcohols.  No pain with percussion teeth.  No visible caries or fractures Eyes:     Conjunctiva/sclera: Conjunctivae normal.     Pupils: Pupils are equal, round, and reactive to light.  Neck:     Comments: TMJ very tender to palpation.  Pain with opening closing mouth over the TMJ.  Tenderness extends into the adjacent cheek.  No soft tissue swelling or  warmth Cardiovascular:     Rate and Rhythm: Normal rate.  Pulmonary:     Effort: Pulmonary effort is normal. No respiratory distress.  Abdominal:     General: There is no distension.     Palpations: Abdomen is soft.  Musculoskeletal:        General: Normal range of motion.     Cervical back: Normal range of motion.  Skin:    General: Skin is warm and dry.  Neurological:     Mental Status: She is alert.      UC Treatments / Results  Labs (all labs ordered are listed, but only abnormal results are displayed) Labs Reviewed - No data to display  EKG   Radiology No results found.  Procedures Procedures (including critical care time)  Medications Ordered in UC Medications - No data to display  Initial Impression / Assessment and Plan / UC Course  I have reviewed the triage vital signs and the nursing notes.  Pertinent labs & imaging results that were available during my care of the patient were reviewed by me and considered in my medical decision making (see chart for details).     Discussed that it is unusual for her TMJ to be this painful acutely.  I told her to watch for a rash because it could be the beginnings of a shingles reaction.  Do not think it is Bell's palsy, nerve pain, tic douloureux because of the nature of the pain.  Will treat with prednisone and have her come back as needed Final Clinical Impressions(s) / UC Diagnoses   Final diagnoses:  Right-sided face pain  TMJ tenderness, right     Discharge Instructions      Soft foods Warmth to area.  Heating pad on low heat only Take prednisone once a day for 5 days Take tramadol if needed for pain in addition.  Do not work on tramadol or drive   ED Prescriptions     Medication Sig Dispense Auth. Provider   predniSONE (DELTASONE) 50  MG tablet Take once a day for 5 days.  Take with food 5 tablet Eustace Moore, MD   traMADol (ULTRAM) 50 MG tablet Take 1 tablet (50 mg total) by mouth every 6 (six)  hours as needed. 15 tablet Eustace Moore, MD      I have reviewed the PDMP during this encounter.   Eustace Moore, MD 05/02/23 [phone]

## 2023-05-02 NOTE — ED Triage Notes (Signed)
Pt reports radiating pain from right corner of mouth across her cheek to her ear x 1 day. Denies injury/issues with teeth that she is aware of

## 2023-05-07 ENCOUNTER — Encounter: Payer: Self-pay | Admitting: Podiatry

## 2023-05-07 ENCOUNTER — Ambulatory Visit (INDEPENDENT_AMBULATORY_CARE_PROVIDER_SITE_OTHER): Payer: Commercial Managed Care - PPO | Admitting: Podiatry

## 2023-05-07 DIAGNOSIS — M216X1 Other acquired deformities of right foot: Secondary | ICD-10-CM | POA: Diagnosis not present

## 2023-05-07 DIAGNOSIS — D492 Neoplasm of unspecified behavior of bone, soft tissue, and skin: Secondary | ICD-10-CM | POA: Diagnosis not present

## 2023-05-07 NOTE — Progress Notes (Signed)
Subjective:   Patient ID: Sherri Walton, female   DOB: 52 y.o.   MRN: [id number]   HPI Patient presents with painful lesion left and also generalized foot pain bilateral with structural abnormality.  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  Discussed shoe gear modifications that I think would be of benefit to her along with insole activity and for the left I went ahead did sterile prep debrided the lesion entirely with sharp sterile instrumentation apply chemical agent to create immune response sterile dressing and advised what to do if blistering were to occur.  May need to address foot structure if symptoms persist and reviewed that fact with Sherri Walton

## 2023-08-27 ENCOUNTER — Ambulatory Visit: Payer: Commercial Managed Care - PPO | Admitting: Podiatry

## 2024-01-18 ENCOUNTER — Encounter: Payer: Self-pay | Admitting: Podiatry

## 2024-01-18 ENCOUNTER — Ambulatory Visit (INDEPENDENT_AMBULATORY_CARE_PROVIDER_SITE_OTHER): Payer: Self-pay | Admitting: Podiatry

## 2024-01-18 VITALS — Ht 67.0 in | Wt 231.0 lb

## 2024-01-18 DIAGNOSIS — M779 Enthesopathy, unspecified: Secondary | ICD-10-CM

## 2024-01-18 NOTE — Progress Notes (Signed)
 Subjective:   Patient ID: Sherri Walton, female   DOB: 53 y.o.   MRN: [id number]   HPI Patient presents stating that the orthotics have lost their ability to support the arch and that she needs new orthotics made.  States that she gets these chronic lesions and chronic inflammation of both feet neuro vas   ROS      Objective:  Physical Exam  Scaler status intact inflammation centered mostly around the fourth MPJ bilateral with generalized inflammation of the feet inflammatory     Assessment:  Capsulitis tendinitis bilateral with foot structural issues with orthotics which have been of great benefit but have lost their support mechanism after 3 to 4 years     Plan:  H&P reviewed and at this point I have recommended conservative care and patient is seen and evaluated by pedorthist with orthotic scanning is done.  Patient will be seen back when those are returned all questions answered

## 2024-01-23 ENCOUNTER — Ambulatory Visit: Admitting: Podiatry

## 2024-02-14 ENCOUNTER — Telehealth: Payer: Self-pay

## 2024-02-14 NOTE — Telephone Encounter (Signed)
 LVM to schedule orthotic PU

## 2024-02-14 NOTE — Telephone Encounter (Signed)
 Pt has an outstaning balance of $1,072.60 with [facility]. She needs to pay the cost of her orthotics upon pick up

## 2024-03-11 NOTE — Telephone Encounter (Signed)
 LVM to schedule orthotic pick up  2ND ATTEMPT  Balance: $298.16

## 2024-04-18 ENCOUNTER — Telehealth: Payer: Self-pay | Admitting: Podiatry

## 2024-04-18 NOTE — Telephone Encounter (Signed)
 The patient states she has left several messages and would like someone to return her cal

## 2024-04-21 NOTE — Telephone Encounter (Signed)
 Patient will be seeing Dr. Magdalen Friday 8/29 can be fit with Orthotics at that time  Lolita Schultze Cped, CFo, CFm

## 2024-04-25 ENCOUNTER — Encounter: Payer: Self-pay | Admitting: Podiatry

## 2024-04-25 ENCOUNTER — Ambulatory Visit: Admitting: Podiatry

## 2024-04-25 DIAGNOSIS — D492 Neoplasm of unspecified behavior of bone, soft tissue, and skin: Secondary | ICD-10-CM

## 2024-04-25 NOTE — Progress Notes (Signed)
 Subjective:   Patient ID: Sherri Walton, female   DOB: 53 y.o.   MRN: [id number]   HPI Patient presents benign neoplasm plantar aspect both feet that sore and was doing well but is back again   ROS      Objective:  Physical Exam  Neurovascular status intact keratotic type lesion with pinpoint bleeding upon debridement noted bilateral     Assessment:  Probability for verruca plantaris or other benign neoplasm     Plan:  Sharp debridement accomplished explained condition do not recommend other treatments but at this time it still is healing and I am hoping that eventually these will eradicate and disappear.  Patient will be seen back as symptoms indicate

## 2024-05-14 ENCOUNTER — Other Ambulatory Visit

## 2024-06-25 ENCOUNTER — Other Ambulatory Visit: Payer: Self-pay | Admitting: Family Medicine

## 2024-06-25 DIAGNOSIS — Z1231 Encounter for screening mammogram for malignant neoplasm of breast: Secondary | ICD-10-CM

## 2024-07-08 ENCOUNTER — Ambulatory Visit
Admission: RE | Admit: 2024-07-08 | Discharge: 2024-07-08 | Disposition: A | Discharge status: Home / Self Care | Source: Ambulatory Visit | Attending: Family Medicine | Admitting: Family Medicine

## 2024-07-08 DIAGNOSIS — Z1231 Encounter for screening mammogram for malignant neoplasm of breast: Secondary | ICD-10-CM

## 2024-07-11 ENCOUNTER — Other Ambulatory Visit: Payer: Self-pay | Admitting: Family Medicine

## 2024-07-11 DIAGNOSIS — R928 Other abnormal and inconclusive findings on diagnostic imaging of breast: Secondary | ICD-10-CM

## 2024-07-23 ENCOUNTER — Encounter

## 2024-07-23 ENCOUNTER — Other Ambulatory Visit

## 2024-08-13 ENCOUNTER — Other Ambulatory Visit: Payer: Self-pay | Admitting: Family Medicine

## 2024-08-13 ENCOUNTER — Inpatient Hospital Stay: Admission: RE | Admit: 2024-08-13 | Discharge: 2024-08-13 | Attending: Family Medicine | Admitting: Family Medicine

## 2024-08-13 ENCOUNTER — Other Ambulatory Visit

## 2024-08-13 DIAGNOSIS — R928 Other abnormal and inconclusive findings on diagnostic imaging of breast: Secondary | ICD-10-CM

## 2024-08-13 DIAGNOSIS — N63 Unspecified lump in unspecified breast: Secondary | ICD-10-CM

## 2024-08-22 ENCOUNTER — Ambulatory Visit
Admission: RE | Admit: 2024-08-22 | Discharge: 2024-08-22 | Disposition: A | Discharge status: Home / Self Care | Source: Ambulatory Visit | Attending: Family Medicine | Admitting: Family Medicine

## 2024-08-22 DIAGNOSIS — N63 Unspecified lump in unspecified breast: Secondary | ICD-10-CM

## 2024-08-22 DIAGNOSIS — R928 Other abnormal and inconclusive findings on diagnostic imaging of breast: Secondary | ICD-10-CM

## 2024-08-22 HISTORY — PX: BREAST BIOPSY: SHX20

## 2024-08-26 ENCOUNTER — Other Ambulatory Visit

## 2024-08-26 ENCOUNTER — Encounter

## 2024-08-26 LAB — SURGICAL PATHOLOGY

## 2024-09-01 NOTE — Progress Notes (Signed)
Released to MyChart

## 2024-09-02 NOTE — Progress Notes (Signed)
 "   REFERRING PHYSICIAN:  Clois Bertrum Pop* PROVIDER:  VICENTA DASIE POLI, MD MRN: [id number] DOB: [birth year] DATE OF ENCOUNTER: 09/02/2024 Subjective    Chief Complaint: New Consultation (NEW BREAST CANCER - Rt breast cancer,)   History of Present Illness: Sherri Walton is a 54 y.o. female who is seen today as an office consultation for evaluation of New Consultation (NEW BREAST CANCER - Rt breast cancer,)   This is a 55 year old female who was found to have a mass on screening mammography in the right breast.  She had had a previous benign biopsy in the left breast for fibrocystic changes 2 years prior.  She underwent further imaging showing a 1.3 x 1.8 x 1.2 cm mass at the 7 o'clock position of the right breast 6-minute from the nipple.  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  Ultrasound the axilla was negative.  She denies nipple discharge.  There is a family history of breast cancer and a niece on her father side.  She has no cardiopulmonary issues and is otherwise without complaints.    Review of Systems: A complete review of systems was obtained from the patient.  I have reviewed this information and discussed as appropriate with the patient.  See HPI as well for other ROS.  ROS   Medical History: Past Medical History:  Diagnosis Date   Hyperlipidemia     There is no problem list on file for this patient.   Past Surgical History:  Procedure Laterality Date   CESAREAN SECTION  1991   LAPAROSCOPIC TUBAL LIGATION  2001   Breast biopsy (Left)  12/15/2022   Breast biopsy (Right)  08/22/2024     Allergies  Allergen Reactions   Ketorolac Itching   Minocycline Swelling    angioedema   Nitrofurantoin Itching and Swelling    angioedema    Current Outpatient Medications on File Prior to Visit  Medication Sig Dispense Refill   fexofenadine (ALLEGRA) 180 MG tablet Take 180 mg by mouth once daily (Patient not taking:  Reported on 09/02/2024)     fluticasone propionate (FLONASE) 50 mcg/actuation nasal spray Place 2 sprays into both nostrils once daily (Patient not taking: Reported on 09/02/2024)     hydrocortisone (ANUSOL-HC) 2.5 % rectal cream Apply rectally 2 times daily (Patient not taking: Reported on 09/02/2024)     sulfamethoxazole-trimethoprim (BACTRIM DS) 800-160 mg tablet Take 1 tablet by mouth 2 (two) times daily (Patient not taking: Reported on 09/02/2024)     traMADoL  (ULTRAM ) 50 mg tablet  (Patient not taking: Reported on 09/02/2024)     No current facility-administered medications on file prior to visit.    Family History  Problem Relation Age of Onset   Gout Mother    High blood pressure (Hypertension) Mother    Diabetes Mother    Liver cancer Father    Alcohol abuse Father    Heart disease Brother    Alcohol abuse Paternal Aunt    Breast cancer Other    Colon polyps Neg Hx    Esophageal cancer Neg Hx    Stomach cancer Neg Hx    Rectal cancer Neg Hx    Colon cancer Neg Hx      Social History   Tobacco Use  Smoking Status Never  Smokeless Tobacco Never     Social History   Socioeconomic History   Marital status: Single  Tobacco Use   Smoking status: Never   Smokeless tobacco: Never  Vaping Use   Vaping status: Never Used  Substance and Sexual Activity   Alcohol use: Never   Drug use: Never   Social Drivers of Corporate Investment Banker Strain: Low Risk (02/07/2024)   Received from [facility]   Overall Financial Resource Strain (CARDIA)    Difficulty of Paying Living Expenses: Not hard at all  Food Insecurity: No Food Insecurity (02/07/2024)   Received from [facility]   Hunger Vital Sign    Within the past 12 months, you worried that your food would run out before you got the money to buy more.: Never true    Within the past 12 months, the food you bought just didn't last and you didn't have money to get more.: Never true  Transportation  Needs: No Transportation Needs (02/07/2024)   Received from [facility] - Transportation    Lack of Transportation (Medical): No    Lack of Transportation (Non-Medical): No  Housing Stability: Low Risk (02/07/2024)   Received from [facility] Stability Vital Sign    In the last 12 months, was there a time when you were not able to pay the mortgage or rent on time?: No    In the past 12 months, how many times have you moved where you were living?: 1    At any time in the past 12 months, were you homeless or living in a shelter (including now)?: No    Objective:   Vitals:   09/02/24 1449  BP: 115/78  Pulse: 89  Temp: 36.6 C (97.9 F)  TempSrc: Temporal  SpO2: 97%  Weight: (!) 102.1 kg (225 lb)  Height: 182.9 cm (6')  PainSc: 0-No pain    Body mass index is 30.52 kg/m.  Physical Exam   She appears well on exam  There are no palpable masses in either breast. There is ecchymosis in the lower outer quadrant of the right breast from her biopsy and there may be a small underlying hematoma.  The nipple areolar complex is normal  There is no axillary adenopathy  Labs, Imaging and Diagnostic Testing: I have reviewed her mammograms, ultrasound, and pathology results  Assessment and Plan:     Diagnoses and all orders for this visit:  Triple negative breast cancer (CMS/HHS-HCC) -     Ambulatory Referral to Oncology-Medical -     Ambulatory Referral to Radiation Oncology -     Ambulatory Referral to Physical Therapy -     Ambulatory Referral to Plastic Surgery -     Ambulatory Referral to Cancer Genetics - [id number]    I gave the patient and her friend a copy of the pathology results and we discussed her diagnosis in detail.  I discussed the multidisciplinary approach to breast cancer.  From a surgical standpoint we discussed breast conservation with a lumpectomy versus mastectomy.  She is actually interested in bilateral mastectomies given the diagnosis  of a triple negative breast cancer.  I also discussed the possibility for the need for chemotherapy and a Port-A-Cath insertion as well.  We also discussed sentinel lymph node biopsy at the time of any surgery.  I explained all surgical procedures in detail.  We discussed the risks of all the procedures which include but are not limited to bleeding, infection, injury to surrounding structures, chronic lymphedema, seroma formation, Port-A-Cath malfunction, pneumothorax, the need for further procedures, excetra. We will refer her to be seen a soon as possible at the cancer center.  We have requested medical oncology, radiation oncology, genetics, and physical therapy evaluation.  We will also refer her to plastic surgeons as she is considering immediate reconstruction. Again, she understands that medical oncology may recommend neoadjuvant chemotherapy and Port-A-Cath insertion prior to any further surgery.  She agrees with our current plans.  Complex medical decision making    VICENTA DASIE POLI, MD    I spent a total of 55 minutes in both face-to-face and non-face-to-face activities, excluding procedures performed, for this visit on the date of this encounter.     "

## 2024-09-03 NOTE — Therapy (Signed)
 " OUTPATIENT PHYSICAL THERAPY BREAST CANCER BASELINE EVALUATION   Patient Name: Sherri Walton MRN: [id number] DOB:May 27, 1971, 54 y.o., female Today's Date: 09/04/2024  END OF SESSION:  PT End of Session - 09/04/24 0935     Visit Number 1    Number of Visits 2    Date for Recertification  11/27/24    Authorization Type no    PT Start Time 0900    PT Stop Time 0930    PT Time Calculation (min) 30 min    Activity Tolerance Patient tolerated treatment well    Behavior During Therapy [facility] for tasks assessed/performed          Past Medical History:  Diagnosis Date   Allergy    seasonal allergies   HLD (hyperlipidemia)    diet controlled   Past Surgical History:  Procedure Laterality Date   BREAST BIOPSY Left 12/15/2022   US  LT BREAST BX W LOC DEV 1ST LESION IMG BX SPEC US  GUIDE 12/15/2022 [facility]MO[facility]   BREAST BIOPSY Right 08/22/2024   US  RT BREAST BX W LOC DEV 1ST LESION IMG BX SPEC US  GUIDE 08/22/2024 [facility] MAMMOGR[facility]   CESAREAN SECTION  1991   TUBAL LIGATION  2001   wisdom teeth  1989   Patient Active Problem List   Diagnosis Date Noted   Hepatitis B immune 08/26/2012   DERMATOGRAPHIA 04/18/2010    PCP: Lorrene Daisy, MD  REFERRING PROVIDER: Vicenta Poli, MD  REFERRING DIAG: Rt breast cancer  THERAPY DIAG:  Triple negative breast cancer (HCC)  Abnormal posture  Rationale for Evaluation and Treatment: Rehabilitation  ONSET DATE: 08/22/24  SUBJECTIVE:                                                                                                                                                                                           SUBJECTIVE STATEMENT: Patient reports she is here today to be seen by her medical team for her newly diagnosed right breast cancer.   PERTINENT HISTORY:  1.3 x 1.8 x 1.2 cm mass at the 7 o'clock position of the right breast. 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  Ultrasound the axilla was negative. Pt is considering a bil mastectomy at this time.  Chemotherapy may be possible.   PATIENT GOALS:   reduce lymphedema risk and learn post op HEP.   PAIN:  Are you having pain? No  PRECAUTIONS: Active CA  RED FLAGS: None   HAND DOMINANCE: right  WEIGHT BEARING RESTRICTIONS: No  FALLS:  Has patient fallen in last 6 months? No  LIVING ENVIRONMENT: Patient lives  with: mother, she is the caregiver  OCCUPATION: Works at [employer] where it is very cold, have to lift around 15 pounds    LEISURE: art, merchant navy officer, decorating   PRIOR LEVEL OF FUNCTION: Independent   OBJECTIVE: Note: Objective measures were completed at Evaluation unless otherwise noted.  COGNITION: Overall cognitive status: Within functional limits for tasks assessed    POSTURE:  Forward head and rounded shoulders posture  UPPER EXTREMITY AROM/PROM:  A/PROM RIGHT   eval   Shoulder extension 53  Shoulder flexion 153  Shoulder abduction 154  Shoulder internal rotation 79  Shoulder external rotation 88    (Blank rows = not tested)  A/PROM LEFT   eval  Shoulder extension 60  Shoulder flexion 164  Shoulder abduction 157  Shoulder internal rotation 80  Shoulder external rotation 80    (Blank rows = not tested)  CERVICAL AROM: All within normal limits:   UPPER EXTREMITY STRENGTH: 5/5  LYMPHEDEMA ASSESSMENTS (in cm):   LANDMARK RIGHT   eval  10 cm proximal to olecranon process from proximal aspect of olecranon 33.7  Olecranon process 30  10 cm proximal to ulnar styloid process from proximal aspect of styloid process 22.4  Just distal to ulnar styloid process 17.5  Across hand at thumb web space 21  At base of 2nd digit 7.3  (Blank rows = not tested)  LANDMARK LEFT   eval  10 cm proximal to olecranon process from proximal aspect of olecranon 33   Olecranon process 29.5  10 cm proximal to ulnar styloid process from proximal aspect of styloid process 22.5   Just distal to ulnar styloid process 17.8  Across hand at thumb web space 21  At base of 2nd digit 7.2  (Blank rows = not tested)  L-DEX LYMPHEDEMA SCREENING: The patient was assessed using the L-Dex machine today to produce a lymphedema index baseline score. The patient will be reassessed on a regular basis (typically every 3 months) to obtain new L-Dex scores. If the score is > 6.5 points away from his/her baseline score indicating onset of subclinical lymphedema, it will be recommended to wear a compression garment for 4 weeks, 12 hours per day and then be reassessed. If the score continues to be > 6.5 points from baseline at reassessment, we will initiate lymphedema treatment. Assessing in this manner has a 95% rate of preventing clinically significant lymphedema.  QUICK DASH SURVEY: 0%  PATIENT EDUCATION:  Education details: Time spent educating patient on aspects of self-care to maximize post op recovery. Patient was educated on where and how to get a post op compression bra to use to reduce post op edema. Patient was also educated on the use of SOZO screenings and surveillance principles for early identification of lymphedema onset. She was instructed to use the post op pillow in the axilla for pressure and pain relief. Patient educated on lymphedema risk reduction and post op shoulder/posture HEP. Person educated: Patient Education method: Explanation, Demonstration, Handout Education comprehension: Patient verbalized understanding and returned demonstration  HOME EXERCISE PROGRAM: Patient was instructed today in a home exercise program today for post op shoulder range of motion. These included active assist shoulder flexion in sitting, scapular retraction, wall walking with shoulder abduction, and hands behind head external rotation.  She was encouraged to do these twice a day, holding 3 seconds and repeating 5 times when permitted by her physician.   ASSESSMENT:  CLINICAL  IMPRESSION: Pt has yet to meet with oncology or have genetics  run, but she is planning to have a bil mastectomy with reconstruction. She will benefit from a post op PT reassessment to determine needs and from L-Dex screens every 3 months for 2 years to detect subclinical lymphedema.  Pt will benefit from skilled therapeutic intervention to improve on the following deficits: Decreased knowledge of precautions, impaired UE functional use, pain, decreased ROM, postural dysfunction.   PT treatment/interventions: ADL/self-care home management, pt/family education, therapeutic exercise  REHAB POTENTIAL: Excellent  CLINICAL DECISION MAKING: Stable/uncomplicated  EVALUATION COMPLEXITY: Low   GOALS: Goals reviewed with patient? YES  LONG TERM GOALS: (STG=LTG)    Name Target Date Goal status  1 Pt will be able to verbalize understanding of pertinent lymphedema risk reduction practices relevant to her dx specifically related to skin care.  Baseline:  No knowledge 09/04/2024 Achieved at eval  2 Pt will be able to return demo and/or verbalize understanding of the post op HEP related to regaining shoulder ROM. Baseline:  No knowledge 09/04/2024 Achieved at eval  3 Pt will be able to verbalize understanding of the importance of viewing the post op After Breast CA Class video for further lymphedema risk reduction education and therapeutic exercise.  Baseline:  No knowledge 09/04/2024 Achieved at eval  4 Pt will demo she has regained full shoulder ROM and function post operatively compared to baselines.  Baseline: See objective measurements taken today. 11/27/24     PLAN:  PT FREQUENCY/DURATION: EVAL and 1 follow up appointment.   PLAN FOR NEXT SESSION: will reassess 3-4 weeks post op to determine needs.   Patient will follow up at outpatient cancer rehab 3-4 weeks following surgery.  If the patient requires physical therapy at that time, a specific plan will be dictated and sent to the referring  physician for approval. The patient was educated today on appropriate basic range of motion exercises to begin post operatively and the importance of viewing the After Breast Cancer class video following surgery.  Patient was educated today on lymphedema risk reduction practices as it pertains to recommendations that will benefit the patient immediately following surgery.  She verbalized good understanding.    Physical Therapy Information for After Breast Cancer Surgery/Treatment:  Lymphedema is a swelling condition that you may be at risk for in your arm if you have lymph nodes removed from the armpit area.  After a sentinel node biopsy, the risk is approximately 5-9% and is higher after an axillary node dissection.  There is treatment available for this condition and it is not life-threatening.  Contact your physician or physical therapist with concerns. You may begin the 4 shoulder/posture exercises (see additional sheet) when permitted by your physician (typically a week after surgery).  If you have drains, you may need to wait until those are removed before beginning range of motion exercises.  A general recommendation is to not lift your arms above shoulder height until drains are removed.  These exercises should be done to your tolerance and gently.  This is not a no pain/no gain type of recovery so listen to your body and stretch into the range of motion that you can tolerate, stopping if you have pain.  If you are having immediate reconstruction, ask your plastic surgeon about doing exercises as he or she may want you to wait. We encourage you to view the After Breast Cancer class video following surgery.  You will learn information related to lymphedema risk, prevention and treatment and additional exercises to regain mobility following surgery.  While undergoing any medical procedure or treatment, try to avoid blood pressure being taken or needle sticks from occurring on the arm on the side of  cancer.   This recommendation begins after surgery and continues for the rest of your life.  This may help reduce your risk of getting lymphedema (swelling in your arm). An excellent resource for those seeking information on lymphedema is the National Lymphedema Network's web site. It can be accessed at www.lymphnet.org If you notice swelling in your hand, arm or breast at any time following surgery (even if it is many years from now), please contact your doctor or physical therapist to discuss this.  Lymphedema can be treated at any time but it is easier for you if it is treated early on.  If you feel like your shoulder motion is not returning to normal in a reasonable amount of time, please contact your surgeon or physical therapist.  [facility] [phone]. [address], [address], [place] [address]  [facility] CLASS After Breast Cancer Class  After Breast Cancer Class is a specially designed exercise class video to assist you in a safe recover after having breast cancer surgery.  In this video you will learn how to get back to full function whether your drains were just removed or if you had surgery a month ago. The video can be viewed on this page: [email or web address].boyd-meyer.org/ or on YouTube here: [email or web address].  Class Goals  Understand specific stretches to improve the flexibility of you chest and shoulder. Learn ways to safely strengthen your upper body and improve your posture. Understand the warning signs of infection and why you may be at risk for an arm infection. Learn about Lymphedema and prevention.  ** You do not need to view this video until after surgery.  Drains should be removed to participate in the recommended exercises on the video.  Patient was instructed today in a home exercise program today for post op shoulder range of motion. These included active assist shoulder  flexion in sitting, scapular retraction, wall walking with shoulder abduction, and hands behind head external rotation.  She was encouraged to do these twice a day, holding 3 seconds and repeating 5 times when permitted by her physician.    Larue Saddie SAUNDERS, PT 09/04/2024, 9:36 AM   "

## 2024-09-04 ENCOUNTER — Encounter: Payer: Self-pay | Admitting: Podiatry

## 2024-09-04 ENCOUNTER — Encounter: Payer: Self-pay | Admitting: Genetic Counselor

## 2024-09-04 ENCOUNTER — Encounter: Payer: Self-pay | Admitting: Rehabilitation

## 2024-09-04 ENCOUNTER — Ambulatory Visit: Admitting: Podiatry

## 2024-09-04 ENCOUNTER — Ambulatory Visit: Attending: Surgery | Admitting: Rehabilitation

## 2024-09-04 ENCOUNTER — Inpatient Hospital Stay: Attending: Nurse Practitioner | Admitting: Genetic Counselor

## 2024-09-04 ENCOUNTER — Other Ambulatory Visit: Payer: Self-pay

## 2024-09-04 ENCOUNTER — Inpatient Hospital Stay

## 2024-09-04 DIAGNOSIS — R293 Abnormal posture: Secondary | ICD-10-CM | POA: Diagnosis present

## 2024-09-04 DIAGNOSIS — C50511 Malignant neoplasm of lower-outer quadrant of right female breast: Secondary | ICD-10-CM | POA: Insufficient documentation

## 2024-09-04 DIAGNOSIS — C50919 Malignant neoplasm of unspecified site of unspecified female breast: Secondary | ICD-10-CM | POA: Insufficient documentation

## 2024-09-04 DIAGNOSIS — M216X1 Other acquired deformities of right foot: Secondary | ICD-10-CM | POA: Diagnosis not present

## 2024-09-04 DIAGNOSIS — Z17421 Hormone receptor negative with human epidermal growth factor receptor 2 negative status: Secondary | ICD-10-CM | POA: Insufficient documentation

## 2024-09-04 DIAGNOSIS — M216X2 Other acquired deformities of left foot: Secondary | ICD-10-CM | POA: Diagnosis not present

## 2024-09-04 DIAGNOSIS — E785 Hyperlipidemia, unspecified: Secondary | ICD-10-CM | POA: Insufficient documentation

## 2024-09-04 LAB — GENETIC SCREENING ORDER

## 2024-09-04 NOTE — Progress Notes (Signed)
 REFERRING PROVIDER: Vernetta Berg, MD   PRIMARY PROVIDER:  Clois Lorrene HERO, MD  PRIMARY REASON FOR VISIT:  1. Triple negative breast cancer (HCC)     HISTORY OF PRESENT ILLNESS:   Ms. Derocher, a 54 y.o. female, was seen for a [place] cancer genetics consultation at the request of Vernetta Berg, MD due to a personal and family history of cancer.  Ms. Otter presents to clinic today to discuss the possibility of a hereditary predisposition to cancer, to discuss genetic testing, and to further clarify her future cancer risks, as well as potential cancer risks for family members.   In 2025, at the age of 34, Ms. Anschutz was diagnosed with right triple negative breast cancer. She has been evaluated by Dr. Vernetta at [facility]. She has been evaluated by physical therapy. She has been referred to radiation oncology, medical oncology and plastic surgery. Considering bilateral mastectomy.   RELEVANT MEDICAL HISTORY:  OCP use for less than 5 years.  Ovaries intact: yes.  Uterus intact: yes.  Colonoscopy: yes; 2021, no polyps. Mammogram within the last year: yes. Previous history of left breast biopsy, benign  Past Medical History:  Diagnosis Date   Allergy    seasonal allergies   HLD (hyperlipidemia)    diet controlled    Past Surgical History:  Procedure Laterality Date   BREAST BIOPSY Left 12/15/2022   US  LT BREAST BX W LOC DEV 1ST LESION IMG BX SPEC US  GUIDE 12/15/2022 [facility]   BREAST BIOPSY Right 08/22/2024   US  RT BREAST BX W LOC DEV 1ST LESION IMG BX SPEC US  GUIDE 08/22/2024 [facility]   CESAREAN SECTION  1991   TUBAL LIGATION  2001   wisdom teeth  1989    Social History   Socioeconomic History   Marital status: Legally Separated    Spouse name: Not on file   Number of children: Not on file   Years of education: Not on file   Highest education level: Not on file  Occupational History   Not on file  Tobacco Use   Smoking status: Never    Smokeless tobacco: Never  Vaping Use   Vaping status: Never Used  Substance and Sexual Activity   Alcohol use: Yes    Comment: special occassions   Drug use: No   Sexual activity: Yes    Birth control/protection: Surgical  Other Topics Concern   Not on file  Social History Narrative   Not on file   Social Drivers of Health   Tobacco Use: Low Risk (09/04/2024)   Patient History    Smoking Tobacco Use: Never    Smokeless Tobacco Use: Never    Passive Exposure: Not on file  Financial Resource Strain: Low Risk (02/07/2024)   Received from [facility]   Overall Financial Resource Strain (CARDIA)    Difficulty of Paying Living Expenses: Not hard at all  Food Insecurity: No Food Insecurity (02/07/2024)   Received from [facility]   Epic    Within the past 12 months, you worried that your food would run out before you got the money to buy more.: Never true    Within the past 12 months, the food you bought just didn't last and you didn't have money to get more.: Never true  Transportation Needs: No Transportation Needs (02/07/2024)   Received from [facility] - Transportation    Lack of Transportation (Medical): No    Lack of Transportation (Non-Medical): No  Physical Activity:  Not on file  Stress: Not on file  Social Connections: Not on file  Depression (EYV7-0): Not on file  Alcohol Screen: Not on file  Housing: Low Risk (02/07/2024)   [facility] from [facility]    In the last 12 months, was there a time when you were not able to pay the mortgage or rent on time?: No    In the past 12 months, how many times have you moved where you were living?: 1    At any time in the past 12 months, were you homeless or living in a shelter (including now)?: No  Utilities: Not At Risk (02/07/2024)   Received from [facility] Utilities    Threatened with loss of utilities: No  Health Literacy: Not on file     FAMILY HISTORY:  We obtained a detailed, 4-generation  family history.  Significant diagnoses are listed below: Family History  Problem Relation Age of Onset   Hypertension Mother    Diabetes Mother    Gout Mother    Liver cancer Father    Alcohol abuse Father    Stomach cancer Father 52   Heart disease Half-Brother    Cancer Maternal Grandmother    Lung cancer Paternal Aunt    Alcohol abuse Paternal Aunt    Cancer Paternal Aunt    Breast cancer Other 27   Leukemia Other 43   Breast cancer Maternal Cousin    Brain cancer Maternal Cousin    Lung cancer Maternal Aunt    Colon polyps Neg Hx    Colon cancer Neg Hx    Esophageal cancer Neg Hx    Rectal cancer Neg Hx       Ms. Pineau is unaware of previous family history of genetic testing for hereditary cancer risks. There is no reported Ashkenazi Jewish ancestry.     GENETIC COUNSELING ASSESSMENT: Ms. Totzke is a 54 y.o. female with a personal and family history of cancer which is somewhat suggestive of a hereditary predisposition to cancer given her personal history of triple negative breast cancer and family history of breast cancer. We, therefore, discussed and recommended the following at today's visit.   DISCUSSION: We discussed that 5 - 10% of cancer is hereditary, with most cases of hereditary breast cancer associated with pathogenic variants in BRCA1/2.  There are other genes that can be associated with hereditary breast cancer syndromes.  We discussed that testing is beneficial for several reasons including knowing how to follow individuals after completing their treatment, identifying whether potential treatment options would be beneficial, and understanding if other family members could be at risk for cancer and allowing them to undergo genetic testing.   We reviewed the characteristics, features and inheritance patterns of hereditary cancer syndromes. We also discussed genetic testing, including the appropriate family members to test, the process of testing, insurance coverage and  turn-around-time for results. We discussed the implications of a negative, positive, carrier and/or variant of uncertain significant result. We recommended Ms. Spark pursue genetic testing for a panel that includes genes associated with breast cancer.   Ms. Brockwell  was offered a common hereditary cancer panel (40+ genes) and an expanded pan-cancer panel (70+ genes). Ms. Hanna was informed of the benefits and limitations of each panel, including that expanded pan-cancer panels contain genes that do not have clear management guidelines at this point in time.  We also discussed that as the number of genes included on a panel increases, the  chances of variants of uncertain significance increases. After considering the benefits and limitations of each gene panel, Ms. Ade elected to have [facility]'s Breast Cancer STAT panel with reflex to the Multi-Cancer +RNA panel. [facility]'s 9 gene Breast Cancer STAT panel includes analysis of the following genes: ATM, BRCA1, BRCA2, CDH1, CHEK2, PALB2, PTEN, STK11 and TP53 (sequencing and deletion/duplication). Turn around time of 5-12 days to help gather information quickly that may impact treatment decisions. The [facility] MultiCancers panel includes analysis of the following 70 genes:AIP, ALK, APC, ATM, AXIN2, BAP1, BARD1, BLM, BMPR1A, BRCA1, BRCA2, BRIP1, CDC73, CDH1, CDK4, CDKN1B, CDKN2A, CHEK2, CTNNA1, DICER1, EGFR, EPCAM, FH, FLCN, GREM1, HOXB13, KIT, LZTR1, MAX, MBD4, MEN1, MET, MITF, MLH1, MSH2, MSH6, MUTYH, NF1, NF2, NTHL1, PALB2, PDGFRA, PMS2, POLD1, POLE, POT1, PRKAR1A, PTCH1, PTEN, RAD51C, RAD51D, RB1, RET, SDHA, SDHAF2, SDHB, SDHC, SDHD, SMAD4, SMARCA4, SMARCB1, SMARCE1, STK11, SUFU, TMEM127, TP53, TSC1, TSC2, VHL. RNA analysis was included for applicable genes.   Based on Ms. Hatcher family history of cancer, she meets medical criteria for genetic testing. Despite that she meets criteria, she may still have an out of pocket cost. We discussed that if her out of pocket  cost for testing is over $100, the laboratory should contact them to discuss self-pay prices, patient pay assistance programs, if applicable, and other billing options.  We discussed that some people do not want to undergo genetic testing due to fear of genetic discrimination.  A federal law called the Genetic Information Non-Discrimination Act (GINA) of 2008 helps protect individuals against genetic discrimination based on their genetic test results.  It impacts both health insurance and employment.  With health insurance, it protects against increased premiums, being kicked off insurance or being forced to take a test in order to be insured.  For employment it protects against hiring, firing and promoting decisions based on genetic test results.  GINA does not apply to those in the [employer], those who work for companies with less than 15 employees, and new life insurance or long-term disability insurance policies.  Health status due to a cancer diagnosis is not protected under GINA.  PLAN: After considering the risks, benefits, and limitations, Ms. Sliwinski provided informed consent to pursue genetic testing and the blood sample was sent to [facility] for analysis of the Breast Cancer STAT and MultiCancer panels. Results should be available within approximately 2-3 weeks' time for the full panel and 5-12 days for the STAT panel, at which point they will be disclosed by telephone to Ms. Baldridge, as will any additional recommendations warranted by these results. Ms. Szalkowski will receive a summary of her genetic counseling visit and a copy of her results once available. This information will also be available in Epic.   Lastly, we encouraged Ms. Mcwilliams to remain in contact with cancer genetics annually so that we can continuously update the family history and inform her of any changes in cancer genetics and testing that may be of benefit for this family.   Ms. Tarazon questions were answered to her  satisfaction today. Our contact information was provided should additional questions or concerns arise. Thank you for the referral and allowing us  to share in the care of your patient.   Burnard Ogren, MS, [facility] Licensed, [email or web address] phone: [phone]   60 minutes were spent on the date of the encounter in service to the patient including preparation, face-to-face consultation, documentation and care coordination.  The patient was seen alone.  Drs. Gudena and/or Lanny were available  to discuss this case as needed.   _______________________________________________________________________ For Office Staff:  Number of people involved in session: 1 Was an Intern/ student involved with case: no

## 2024-09-05 NOTE — Progress Notes (Signed)
 Location of Breast Cancer: Right Breast Cancer   Histology per Pathology Report:    Receptor Status: ER(Negative), PR (Negative), Her2-neu (Negative), Ki-67(40%)  Did patient present with symptoms (if so, please note symptoms) or was this found on screening mammography?:  Mammogram  Past/Anticipated interventions by surgeon, if any:{t:21944} ***  Past/Anticipated interventions by medical oncology, if any: Chemotherapy ***  Lymphedema issues, if any:  {:18581} {t:21944}   Pain issues, if any:  {:18581} {PAIN DESCRIPTION:21022940}  SAFETY ISSUES: Prior radiation? {:18581} Pacemaker/ICD? {:18581} Possible current pregnancy?{:18581} Is the patient on methotrexate? {:18581}  Current Complaints / other details:  ***

## 2024-09-05 NOTE — Progress Notes (Signed)
 Subjective:   Patient ID: Sherri Walton, female   DOB: 54 y.o.   MRN: [id number]   HPI Patient presents with chronic foot structural issues bilateral creating lesions that get painful   ROS      Objective:  Physical Exam  Neurovascular status is noted to be intact and unchanged with lesions bilateral that are painful when pressed and depression of the arch bilaterally     Assessment:  Chronic lesions chronic structural issues     Plan:  Reviewed at great length causes of problems reviewed foot structure orthotic usage and supportive shoes.  Courtesy debridement of lesions patient will be seen back as symptoms indicate all questions answered today

## 2024-09-08 ENCOUNTER — Ambulatory Visit: Admitting: Plastic Surgery

## 2024-09-08 VITALS — BP 115/78 | HR 89 | Ht 67.0 in | Wt 221.0 lb

## 2024-09-08 DIAGNOSIS — C50011 Malignant neoplasm of nipple and areola, right female breast: Secondary | ICD-10-CM | POA: Diagnosis not present

## 2024-09-08 NOTE — Progress Notes (Signed)
 "   Referring Provider Clois Lorrene HERO, MD [address] [place],  [place] [address]   CC:  Chief Complaint  Patient presents with   Breast Cancer      Esta Carmon is an 54 y.o. female.  HPI: Ms. 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  Her oncologic surgeon is Dr. Vicenta Poli.  She has decided that she would like to undergo bilateral mastectomies.  She is referred today for discussion of reconstruction options after mastectomy.  She denies any other significant breast issues.  She did have a benign breast biopsy of the left breast several years ago.  She has considered breast reduction in the past and is unhappy with the large size of her breast and would like to be smaller after her reconstruction.  Allergies[1]  Outpatient Encounter Medications as of 09/08/2024  Medication Sig   fexofenadine (ALLEGRA) 180 MG tablet Take 180 mg by mouth daily.   No facility-administered encounter medications on file as of 09/08/2024.     Past Medical History:  Diagnosis Date   Allergy    seasonal allergies   HLD (hyperlipidemia)    diet controlled    Past Surgical History:  Procedure Laterality Date   BREAST BIOPSY Left 12/15/2022   US  LT BREAST BX W LOC DEV 1ST LESION IMG BX SPEC US  GUIDE 12/15/2022 [facility]   BREAST BIOPSY Right 08/22/2024   US  RT BREAST BX W LOC DEV 1ST LESION IMG BX SPEC US  GUIDE 08/22/2024 [facility]   CESAREAN SECTION  1991   TUBAL LIGATION  2001   wisdom teeth  1989    Family History  Problem Relation Age of Onset   Hypertension Mother    Diabetes Mother    Gout Mother    Liver cancer Father    Alcohol abuse Father    Stomach cancer Father 55   Heart disease Half-Brother    Cancer Maternal Grandmother    Lung cancer Paternal Aunt    Alcohol abuse Paternal Aunt    Cancer Paternal Aunt    Breast cancer Other 27   Leukemia Other 43   Breast  cancer Maternal Cousin    Brain cancer Maternal Cousin    Lung cancer Maternal Aunt    Colon polyps Neg Hx    Colon cancer Neg Hx    Esophageal cancer Neg Hx    Rectal cancer Neg Hx     Social History   Social History Narrative   Not on file     Review of Systems General: Denies fevers, chills, weight loss CV: Denies chest pain, shortness of breath, palpitations Breast: Recently diagnosed breast cancer  Physical Exam    09/08/2024   10:25 AM 01/18/2024    9:25 AM 05/02/2023    4:33 PM  Vitals with BMI  Height 5' 7 5' 7   Weight 221 lbs 231 lbs   BMI 34.61 36.17   Systolic 115  119  Diastolic 78  81  Pulse 89  78    General:  No acute distress,  Alert and oriented, Non-Toxic, Normal speech and affect Breast: Patient has very large pendulous breast with grade 3 ptosis.  She has a base width of approximately 14 cm. Mammogram: Findings as noted. Assessment/Plan Right breast cancer: Patient is currently considering bilateral mastectomies as treatment for her breast cancer.  She has appointments with Dr. Izell in radiation oncology and Dr. Odean in  oncology scheduled.  We had a long discussion this morning regarding reconstructive options after mastectomy.  We discussed autologous reconstruction using the IEP flaps.  I showed her the basic anatomy of the reconstruction.  I showed her where the flap is harvested.  We discussed the fact that this is her own tissue and she does not have a concern regarding implants if she proceeds with autologous reconstruction.  We discussed the fact that the reconstruction will not occur for approximately 6 months after surgery.  She may have tissue expanders placed at the time of her mastectomies to hold space for the flaps.  We discussed the surgical time, the ICU stay, and the hospital stay after free flap reconstruction. We then discussed implant-based reconstruction.  We discussed placement of tissue expanders either below or above the muscle  after completion of the mastectomies.  We discussed the process for tissue expansion and the need for return to the operating room to remove the tissue expanders and placed the silicone implants.  We discussed the fact that this is a relatively shorter investment of time on her part however she will have a foreign body, the breast implant, that has a higher risk of infection and may require replacement in the future.  She understands that with either of these options there are usually revisions that are required after the surgery to achieve her final aesthetic goal.  She is interested in autologous reconstruction.  I will set up an appointment for her to speak with Dr. Montorfano.  All questions were answered to her satisfaction.  Photographs were obtained today with her consent.  Will coordinate with Dr. Damian office based on her needs after discussion with Dr. Eustacio Leonce KATHEE Waddell 09/08/2024, 12:49 PM         [1]  Allergies Allergen Reactions   Minocycline Swelling    angioedema   Nitrofurantoin Itching and Swelling    angioedema   Ketorolac Tromethamine Itching   "

## 2024-09-09 ENCOUNTER — Ambulatory Visit
Admission: RE | Admit: 2024-09-09 | Discharge: 2024-09-09 | Disposition: A | Discharge status: Home / Self Care | Source: Ambulatory Visit | Attending: Radiation Oncology | Admitting: Radiation Oncology

## 2024-09-09 ENCOUNTER — Other Ambulatory Visit: Payer: Self-pay

## 2024-09-09 ENCOUNTER — Ambulatory Visit: Attending: Family Medicine

## 2024-09-09 VITALS — BP 134/77 | HR 80 | Temp 97.8°F | Resp 20 | Ht 67.0 in | Wt 222.0 lb

## 2024-09-09 DIAGNOSIS — Z79899 Other long term (current) drug therapy: Secondary | ICD-10-CM | POA: Diagnosis not present

## 2024-09-09 DIAGNOSIS — C50511 Malignant neoplasm of lower-outer quadrant of right female breast: Secondary | ICD-10-CM | POA: Insufficient documentation

## 2024-09-09 DIAGNOSIS — Z801 Family history of malignant neoplasm of trachea, bronchus and lung: Secondary | ICD-10-CM | POA: Insufficient documentation

## 2024-09-09 DIAGNOSIS — Z803 Family history of malignant neoplasm of breast: Secondary | ICD-10-CM | POA: Insufficient documentation

## 2024-09-09 DIAGNOSIS — Z806 Family history of leukemia: Secondary | ICD-10-CM | POA: Insufficient documentation

## 2024-09-09 DIAGNOSIS — E785 Hyperlipidemia, unspecified: Secondary | ICD-10-CM | POA: Insufficient documentation

## 2024-09-09 DIAGNOSIS — Z171 Estrogen receptor negative status [ER-]: Secondary | ICD-10-CM | POA: Diagnosis not present

## 2024-09-09 DIAGNOSIS — Z1722 Progesterone receptor negative status: Secondary | ICD-10-CM | POA: Diagnosis not present

## 2024-09-09 DIAGNOSIS — Z1732 Human epidermal growth factor receptor 2 negative status: Secondary | ICD-10-CM | POA: Diagnosis not present

## 2024-09-09 NOTE — Progress Notes (Signed)
 " Radiation Oncology         [phone] ________________________________  Initial outpatient Consultation  Name: Sherri Walton MRN: [id number]  Date: 09/09/2024  DOB: 1970-11-21  CC:Clois Lorrene HERO, MD  Vernetta Berg, MD   REFERRING PHYSICIAN: Vernetta Berg, MD  DIAGNOSIS:    ICD-10-CM   1. Malignant neoplasm of lower-outer quadrant of right breast of female, estrogen receptor negative (HCC)  C50.511    Z17.1        Cancer Staging  Breast cancer of lower-outer quadrant of right female breast [facility]) Staging form: Breast, AJCC 8th Edition - Clinical stage from 09/09/2024: Stage IB (cT1c, cN0, cM0, G2, ER-, PR-, HER2-) - Signed by Izell Domino, MD on 09/09/2024 Stage prefix: Initial diagnosis Histologic grading system: 3 grade system   Invasive moderately differentiated ductal carcinoma , ER- / PR- / Her2-, Grade 2  CHIEF COMPLAINT: Here to discuss management of right breast cancer  HISTORY OF PRESENT ILLNESS::Sherri Walton is a 54 y.o. female who presented with breast abnormality on the following imaging: bilateral screening mammogram on the date of 08/13/24.  No symptoms, if any, at that time, were reported.  Ultrasound of breast on 08/13/24 revealed a 1.3 x 1.8 x 1.2 cm mass at the 7 o'clock position of the right breast. Additional imaging of the right axilla was unremarkable.  In light of findings, she then underwent a right breast needle core biopsy on date of 08/22/24 which showed grade 2 invasive moderately differentiated ductal carcinoma.  ER status: negative; PR status negative, Her2 status negative; Grade 2. Ki67: 40%   Subsequently, she was seen by Dr. Vernetta on 09/03/23 to discuss further treatment plan. Upon discussion,  she stated interest in bilateral mastectomies  Most recent visit with Dr. Leonce of plastic surgery, she expressed interest in undergoing autologous reconstruction following her bilateral mastectomies.   Patient has a family  history of breast cancer and a niece on her father side. She also underwent an FNA biopsy of the left breast about 2 years ago for a suspicious mass. Pathology at that time was benign.   Patient underwent genetic testing on 09/05/23 given her family history of breast cancer. Results pending at this time.   She sees medical oncology tomorrow.  She is accompanied by her supportive family member today.  She reports that her work is fairly physical and she is on her feet much of the day.  She does quite a bit of lifting at work.  PREVIOUS RADIATION THERAPY: No  PAST MEDICAL HISTORY:  has a past medical history of Allergy and HLD (hyperlipidemia).    PAST SURGICAL HISTORY: Past Surgical History:  Procedure Laterality Date   BREAST BIOPSY Left 12/15/2022   US  LT BREAST BX W LOC DEV 1ST LESION IMG BX SPEC US  GUIDE 12/15/2022 [facility]   BREAST BIOPSY Right 08/22/2024   US  RT BREAST BX W LOC DEV 1ST LESION IMG BX SPEC US  GUIDE 08/22/2024 [facility]   CESAREAN SECTION  1991   TUBAL LIGATION  2001   wisdom teeth  1989    FAMILY HISTORY: family history includes Alcohol abuse in her father and paternal aunt; Brain cancer in her maternal cousin; Breast cancer in her maternal cousin; Breast cancer (age of onset: 77) in an other family member; Cancer in her maternal grandmother and paternal aunt; Diabetes in her mother; Gout in her mother; Heart disease in her half-brother; Hypertension in her mother; Leukemia (age of onset: 63) in an other family  member; Liver cancer in her father; Lung cancer in her maternal aunt and paternal aunt; Stomach cancer (age of onset: 66) in her father.  SOCIAL HISTORY:  reports that she has never smoked. She has never used smokeless tobacco. She reports current alcohol use. She reports that she does not use drugs.  ALLERGIES: Minocycline, Nitrofurantoin, and Ketorolac tromethamine  MEDICATIONS:  Current Outpatient Medications  Medication Sig Dispense Refill    fexofenadine (ALLEGRA) 180 MG tablet Take 180 mg by mouth daily. (Patient taking differently: Take 180 mg by mouth as needed for allergies.)     No current facility-administered medications for this encounter.    REVIEW OF SYSTEMS: As above in HPI.   PHYSICAL EXAM:  height is 5' 7 (1.702 m) and weight is 222 lb (100.7 kg). Her temperature is 97.8 F (36.6 C). Her blood pressure is 134/77 and her pulse is 80. Her respiration is 20 and oxygen saturation is 98%.   General: Alert and oriented, in no acute distress HEENT: Head is normocephalic. Extraocular movements are intact.  Heart: Regular in rate and rhythm with no murmurs, rubs, or gallops. Chest: Clear to auscultation bilaterally, with no rhonchi, wheezes, or rales. Abdomen: Soft, nontender, nondistended, with no rigidity or guarding. Extremities: No cyanosis or edema. Skin: No concerning lesions. Musculoskeletal: symmetric strength and muscle tone throughout. Neurologic: Cranial nerves II through XII are grossly intact. No obvious focalities. Speech is fluent. Coordination is intact. Psychiatric: Judgment and insight are intact. Affect is appropriate. Breasts: There is a palpable mass that is at least 3 cm in the 7:00 region of the right breast.  Some of this may include postbiopsy changes.. No other palpable masses appreciated in the breasts or axillae bilaterally.   ECOG = 0  0 - Asymptomatic (Fully active, able to carry on all predisease activities without restriction)  1 - Symptomatic but completely ambulatory (Restricted in physically strenuous activity but ambulatory and able to carry out work of a light or sedentary nature. For example, light housework, office work)  2 - Symptomatic, <50% in bed during the day (Ambulatory and capable of all self care but unable to carry out any work activities. Up and about more than 50% of waking hours)  3 - Symptomatic, >50% in bed, but not bedbound (Capable of only limited self-care,  confined to bed or chair 50% or more of waking hours)  4 - Bedbound (Completely disabled. Cannot carry on any self-care. Totally confined to bed or chair)  5 - Death   Raylene MM, Creech RH, Tormey DC, et al. [phone]). Toxicity and response criteria of the [facility]. Am. DOROTHA Bridges. Oncol. 5 (6): 649-55   LABORATORY DATA:   CBC    Component Value Date/Time   WBC 6.2 08/05/2018 0134   RBC 4.50 08/05/2018 0134   HGB 13.8 08/05/2018 0134   HCT 43.2 08/05/2018 0134   PLT 280 08/05/2018 0134   MCV 96.0 08/05/2018 0134   MCV 98.8 (A) 08/26/2012 1500   MCH 30.7 08/05/2018 0134   MCHC 31.9 08/05/2018 0134   RDW 12.8 08/05/2018 0134   LYMPHSABS 1.5 08/21/2017 0745   MONOABS 0.7 08/21/2017 0745   EOSABS 0.0 08/21/2017 0745   BASOSABS 0.0 08/21/2017 0745    CMP     Component Value Date/Time   NA 138 08/05/2018 0134   K 3.6 08/05/2018 0134   CL 106 08/05/2018 0134   CO2 23 08/05/2018 0134   GLUCOSE 88 08/05/2018 0134   BUN 14  08/05/2018 0134   CREATININE 0.70 08/05/2018 0134   CREATININE 0.71 08/26/2012 1437   CALCIUM 9.2 08/05/2018 0134   PROT 8.2 (H) 08/21/2017 0745   ALBUMIN 4.2 08/21/2017 0745   AST 30 08/21/2017 0745   ALT 27 08/21/2017 0745   ALKPHOS 78 08/21/2017 0745   BILITOT 1.0 08/21/2017 0745   GFRNONAA >60 08/05/2018 0134      RADIOGRAPHY: US  RT BREAST BX W LOC DEV 1ST LESION IMG BX SPEC US  GUIDE Addendum Date: 08/26/2024 ADDENDUM REPORT: 08/26/2024 13:21 ADDENDUM: PATHOLOGY revealed: Breast, right, needle core biopsy, 7 o'clock, 6 cmfn, coil clip- INVASIVE MODERATELY DIFFERENTIATED DUCTAL CARCINOMA, GRADE 2- OVERALL GRADE: 2- ANGIOLYMPHATIC INVASION NOT IDENTIFIED- TUMOR MEASURES 9 MM IN GREATEST LINEAR EXTENT Pathology results are CONCORDANT with imaging findings, per Dr. Curtistine Noble. Pathology results and recommendations below were discussed with patient by telephone. Patient reported biopsy site doing well with no adverse symptoms, and  only slight tenderness at the site. Post biopsy care instructions were reviewed, questions were answered and my direct phone number was provided. Patient was instructed to call [facility] for any additional questions or concerns related to biopsy site. RECOMMENDATION: 1. Surgical and oncological consultation. Request for surgical consultation relayed to Olam Bunnell and Landry Finger at [facility]. 2. Consider pretreatment bilateral breast MRI to determine extent of breast disease. Pathology results reported by Mliss CHARM Molt RN 08/26/2024. Electronically Signed   By: Curtistine Noble   On: 08/26/2024 13:21   Result Date: 08/26/2024 CLINICAL DATA:  54 year old female presents for a right breast ultrasound-guided biopsy. EXAM: ULTRASOUND GUIDED RIGHT BREAST CORE NEEDLE BIOPSY COMPARISON:  Previous exam(s). PROCEDURE: The procedure was discussed with the patient including benefits and alternatives. We discussed the risks of the procedure, including infection, bleeding, tissue injury and inadequate sampling. Post biopsy clip placement and possible clip migration were also discussed. Informed written consent was given. The usual time-out protocol was performed immediately prior to the procedure. Right breast: The patient was scanned and the area of interest in the 7 o' clock position, 6 cm from the nipple, was localized which correlates with the area of concern seen on prior imaging studies. This area was targeted for ultrasound guided core needle biopsy. After sterile skin prep and 1% lidocaine with and without epinephrine  for local anesthesia, a 14 gauge spring-loaded biopsy needle was used under direct ultrasound visualization to obtain several cores of tissue from the mass using a lateral to medial approach. Next, under ultrasound guidance, a coil shaped clip was placed in the sampled mass. There were no immediate post-procedure complications. A follow up 2 view mammogram was  performed and dictated separately. IMPRESSION: Ultrasound guided biopsy of the right breast as above. No apparent complications. Electronically Signed: By: Curtistine Noble On: 08/22/2024 09:56   MM CLIP PLACEMENT RIGHT Result Date: 08/22/2024 CLINICAL DATA:  54 year old female status post right breast ultrasound-guided biopsy. EXAM: 3D DIAGNOSTIC RIGHT MAMMOGRAM POST ULTRASOUND BIOPSY COMPARISON:  Previous exam(s). ACR Breast Density Category b: There are scattered areas of fibroglandular density. FINDINGS: 3D Mammographic images were obtained following ultrasound guided biopsy of the right breast at about the 7 o'clock position, approximately 6 cm from the nipple. The biopsy marking clip is in expected position at the site of biopsy. IMPRESSION: Appropriate positioning of the coil shaped biopsy marking clip at the site of biopsy in the right breast as above. Final Assessment: Post Procedure Mammograms for Marker Placement Electronically Signed   By: Curtistine  Freiler   On: 08/22/2024 10:10   MM 3D DIAGNOSTIC MAMMOGRAM UNILATERAL LEFT BREAST Result Date: 08/13/2024 CLINICAL DATA:  54 year old female recalled for a possible right breast mass and a possible left breast asymmetry. EXAM: DIGITAL DIAGNOSTIC UNILATERAL LEFT MAMMOGRAM WITH TOMOSYNTHESIS AND CAD; ULTRASOUND RIGHT BREAST LIMITED TECHNIQUE: Left digital diagnostic mammography and breast tomosynthesis was performed. The images were evaluated with computer-aided detection. ; Targeted ultrasound examination of the right breast was performed Additional 2 and 3D spot compression views were obtained. COMPARISON:  Previous exam(s). ACR Breast Density Category b: There are scattered areas of fibroglandular density. FINDINGS: Left mammogram: Spot compression views demonstrate a 4 mm circumscribed mass with a lucent fatty hilum consistent with an intramammary lymph node about the outer breast, anterior to middle third. No findings suspicious for malignancy.  Right breast ultrasound: Targeted imaging at about the 7 o'clock position, approximately 6 centimeters from the nipple, demonstrates an irregular hypoechoic mass with irregular margins and posterior acoustic shadowing measuring approximately 1.3 x 1.8 x 1.2 cm. This correlates favorably with the mammographic findings seen on the comparison mammogram. Additional imaging of the right axilla was unremarkable. IMPRESSION: Suspicious 1.8 cm right breast mass as above. RECOMMENDATION: Right breast ultrasound-guided biopsy. I have discussed the findings and recommendations with the patient. If applicable, a reminder letter will be sent to the patient regarding the next appointment. BI-RADS CATEGORY  4: Suspicious. Electronically Signed   By: Curtistine Noble   On: 08/13/2024 09:59   US  LIMITED ULTRASOUND INCLUDING AXILLA RIGHT BREAST Result Date: 08/13/2024 CLINICAL DATA:  54 year old female recalled for a possible right breast mass and a possible left breast asymmetry. EXAM: DIGITAL DIAGNOSTIC UNILATERAL LEFT MAMMOGRAM WITH TOMOSYNTHESIS AND CAD; ULTRASOUND RIGHT BREAST LIMITED TECHNIQUE: Left digital diagnostic mammography and breast tomosynthesis was performed. The images were evaluated with computer-aided detection. ; Targeted ultrasound examination of the right breast was performed Additional 2 and 3D spot compression views were obtained. COMPARISON:  Previous exam(s). ACR Breast Density Category b: There are scattered areas of fibroglandular density. FINDINGS: Left mammogram: Spot compression views demonstrate a 4 mm circumscribed mass with a lucent fatty hilum consistent with an intramammary lymph node about the outer breast, anterior to middle third. No findings suspicious for malignancy. Right breast ultrasound: Targeted imaging at about the 7 o'clock position, approximately 6 centimeters from the nipple, demonstrates an irregular hypoechoic mass with irregular margins and posterior acoustic shadowing  measuring approximately 1.3 x 1.8 x 1.2 cm. This correlates favorably with the mammographic findings seen on the comparison mammogram. Additional imaging of the right axilla was unremarkable. IMPRESSION: Suspicious 1.8 cm right breast mass as above. RECOMMENDATION: Right breast ultrasound-guided biopsy. I have discussed the findings and recommendations with the patient. If applicable, a reminder letter will be sent to the patient regarding the next appointment. BI-RADS CATEGORY  4: Suspicious. Electronically Signed   By: Curtistine Noble   On: 08/13/2024 09:59      IMPRESSION/PLAN:  Cancer Staging  Breast cancer of lower-outer quadrant of right female breast [facility]) Staging form: Breast, AJCC 8th Edition - Clinical stage from 09/09/2024: Stage IB (cT1c, cN0, cM0, G2, ER-, PR-, HER2-) - Signed by Izell Domino, MD on 09/09/2024 Stage prefix: Initial diagnosis Histologic grading system: 3 grade system  Before the patient met me she was heavily leaning towards bilateral mastectomies.  We talked about the alternative of lumpectomy and adjuvant radiation therapy today.  We also talked about the likelihood that she would need to undergo chemotherapy, which will  be discussed tomorrow with medical oncology.  She understands that sometimes chemotherapy is given prior to surgery and sometimes that is given after surgery.  We discussed the importance of getting the results to her genetic testing.  If the genetic testing is negative for any high risk genes she is a good candidate for breast conserving surgery.  She understands that based on the data that she would not have a better life expectancy with bilateral mastectomies compared to breast conserving surgery and adjuvant radiation.  We did discuss that there would be a slightly lower risk of developing a recurrence in the right breast or a new cancer in the left breast if she undergoes bilateral mastectomies , acknowledging that skin recurrences do occur  occasionally after bilateral mastectomies.  We also discussed that occasionally patients still need adjuvant radiation if they undergo mastectomies, such as if the tumor is much larger than anticipated, there are positive margins, and/or significant lymph node involvement that does not respond to chemotherapy.   She has many good questions today.  Her family asked many good questions as well.  She states that upon diagnosis she felt a sense of urgency to have bilateral mastectomies, thinking that it would significantly improve her prognosis.  Now, she is starting to digest the information more and is strongly considering breast conserving surgery.  She would like to meet with medical oncology tomorrow and continue to think through her options as well as wait for the results to her genetic testing  It was a pleasure meeting the patient today. We discussed the risks, benefits, and side effects of radiotherapy.  We discussed that radiation would take approximately 4 weeks to complete and that I would give the patient a few weeks to heal following surgery before starting treatment planning.   If chemotherapy were to be given, this would precede radiotherapy (and possibly surgery). We spoke about acute effects including skin irritation and fatigue as well as much less common late effects including internal organ injury or irritation. We spoke about the latest technology that is used to minimize the risk of late effects for patients undergoing radiotherapy to the breast or chest wall. No guarantees of treatment were given. The patient is enthusiastic about proceeding with treatment if warranted. I look forward to participating in the patient's care if needed.  I will await her referral back to me for postoperative follow-up and eventual CT simulation/treatment planning if needed.  On date of service, in total, I spent 60 minutes on this encounter. Patient was seen in person.    __________________________________________   Lauraine Golden, MD  This document serves as a record of services personally performed by Lauraine Golden, MD. It was created on her behalf by Reymundo Cartwright, a trained medical scribe. The creation of this record is based on the scribe's personal observations and the provider's statements to them. This document has been checked and approved by the attending provider.  "

## 2024-09-10 ENCOUNTER — Inpatient Hospital Stay: Admitting: Hematology and Oncology

## 2024-09-10 ENCOUNTER — Inpatient Hospital Stay

## 2024-09-10 ENCOUNTER — Encounter: Payer: Self-pay | Admitting: *Deleted

## 2024-09-10 ENCOUNTER — Other Ambulatory Visit: Payer: Self-pay | Admitting: Surgery

## 2024-09-10 ENCOUNTER — Encounter: Payer: Self-pay | Admitting: Genetic Counselor

## 2024-09-10 ENCOUNTER — Telehealth: Payer: Self-pay | Admitting: Genetic Counselor

## 2024-09-10 VITALS — BP 111/72 | HR 80 | Temp 98.2°F | Resp 18 | Ht 67.0 in | Wt 222.5 lb

## 2024-09-10 DIAGNOSIS — Z853 Personal history of malignant neoplasm of breast: Secondary | ICD-10-CM

## 2024-09-10 DIAGNOSIS — Z17421 Hormone receptor negative with human epidermal growth factor receptor 2 negative status: Secondary | ICD-10-CM | POA: Diagnosis not present

## 2024-09-10 DIAGNOSIS — E785 Hyperlipidemia, unspecified: Secondary | ICD-10-CM | POA: Diagnosis not present

## 2024-09-10 DIAGNOSIS — C50511 Malignant neoplasm of lower-outer quadrant of right female breast: Secondary | ICD-10-CM | POA: Diagnosis not present

## 2024-09-10 NOTE — Progress Notes (Signed)
 Saw patient for initial med onc appt with Dr. Loretha. She brought her good friend (calls her her cousin) Holley who took detailed notes.  Today, after information from genetics this am and talking to MD, patient decided on lumpectomy vs mastectomy and reconstruction. Patient/Dr. Loretha requested I call and cancel her appt for next week with Dr. Montorfano. Message left with his office to call me back. ECHO ordered. Patient to have port placed with lumpectomy followed by chemo and radiation. Faith is very important to her. Has met with social work previously. Inbasket sent to chaplain to touch base with patient.   Navigator explained role and gave her my contact information. She has support group information, Breast Cancer Book, Journey notebook. When surgery date is made will schedule f/u with Dr. Loretha 2-3 weeks out. Patient told navigator that she feels very encouraged about her plan and really appreciated all of the providers she has met with.

## 2024-09-10 NOTE — Progress Notes (Signed)
 Spoke to [place] at [facility] and appt for 1/19 is now cancelled.

## 2024-09-10 NOTE — Progress Notes (Signed)
 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  We have reviewed the pathology results in detail.  Given triple negative biology, tumor size less than 2 cm, we have discussed about adjuvant chemotherapy.  We have reviewed AC-T regimen given her young age and robust performance status.  After chemotherapy, she will proceed with adjuvant radiation.  Goals antiestrogen therapy.  We have reviewed role of surveillance, MRD testing, annual mammograms.  With regards to bilateral mastectomy, since she has no underlying genetic mutations, she is reconsidering and would like to proceed with lumpectomy alone.  We agreed that there is no medical necessity for bilateral mastectomy but is a personal preference  She will return to clinic after surgery to review chemotherapy adverse effects and additional recommendations.  She will have a port placed during surgery.  Echocardiogram to be ordered by our NN team.   Thank you for consulting us  in the care of this patient.  Please do not hesitate to contact us  with any additional questions  HISTORY OF PRESENTING ILLNESS:  Sherri Walton 54 y.o. female is here because of new diagnosis of breast cancer  This is a very pleasant 54 year old female patient with past medical history significant for hyperlipidemia newly diagnosed with right sided triple negative breast cancer here for additional recommendations.  She arrived with her friend Pam today.  At baseline she is very healthy.  She might have felt this mass even before the mammogram but she was uncertain given her large dense  breast.  She had a screening mammogram which prompted a diagnostic mammogram and biopsy.  She is here to review the pathology results and medical oncology recommendations.  She has already met with surgical oncology as well as radiation oncology.  She originally was leaning towards bilateral mastectomy with reconstruction however given the negative genetic testing report this morning, she is reconsidering the surgery.  Rest of the pertinent 10 point ROS reviewed and negative.  MEDICAL HISTORY:  Past Medical History:  Diagnosis Date   Allergy    seasonal allergies   HLD (hyperlipidemia)    diet controlled    SURGICAL HISTORY: Past Surgical History:  Procedure Laterality Date   BREAST BIOPSY Left 12/15/2022   US  LT BREAST BX W LOC DEV 1ST LESION IMG BX SPEC US  GUIDE 12/15/2022 [facility]   BREAST BIOPSY Right 08/22/2024   US  RT BREAST BX W LOC DEV 1ST LESION IMG BX SPEC US  GUIDE 08/22/2024 [facility]   CESAREAN SECTION  1991   TUBAL LIGATION  2001   wisdom teeth  1989    SOCIAL HISTORY: Social History   Socioeconomic History   Marital status: Legally Separated    Spouse name: Not on file   Number of children: Not on file   Years of education: Not on file   Highest education level: Not on file  Occupational History   Not on file  Tobacco Use   Smoking status: Never   Smokeless tobacco: Never  Vaping Use   Vaping status: Never Used  Substance and Sexual Activity   Alcohol use: Yes    Comment: special occassions  Drug use: No   Sexual activity: Yes    Birth control/protection: Surgical  Other Topics Concern   Not on file  Social History Narrative   Not on file   Social Drivers of Health   Tobacco Use: Low Risk (09/04/2024)   Patient History    Smoking Tobacco Use: Never    Smokeless Tobacco Use: Never    Passive Exposure: Not on file  Financial Resource Strain: Low Risk (02/07/2024)   Received from [facility]   Overall Financial Resource Strain  (CARDIA)    Difficulty of Paying Living Expenses: Not hard at all  Food Insecurity: No Food Insecurity (09/09/2024)   Epic    Worried About Programme Researcher, Broadcasting/film/video in the Last Year: Never true    Ran Out of Food in the Last Year: Never true  Transportation Needs: No Transportation Needs (09/09/2024)   Epic    Lack of Transportation (Medical): No    Lack of Transportation (Non-Medical): No  Physical Activity: Not on file  Stress: Not on file  Social Connections: Not on file  Intimate Partner Violence: Not At Risk (09/09/2024)   Epic    Fear of Current or Ex-Partner: No    Emotionally Abused: No    Physically Abused: No    Sexually Abused: No  Depression (PHQ2-9): Low Risk (09/09/2024)   Depression (PHQ2-9)    PHQ-2 Score: 0  Alcohol Screen: Not on file  Housing: Low Risk (09/09/2024)   Epic    Unable to Pay for Housing in the Last Year: No    Number of Times Moved in the Last Year: 0    Homeless in the Last Year: No  Utilities: Not At Risk (09/09/2024)   Epic    Threatened with loss of utilities: No  Health Literacy: Not on file    FAMILY HISTORY: Family History  Problem Relation Age of Onset   Hypertension Mother    Diabetes Mother    Gout Mother    Liver cancer Father    Alcohol abuse Father    Stomach cancer Father 83   Uterine cancer Maternal Grandmother    Lung cancer Maternal Aunt    Lung cancer Paternal Aunt    Alcohol abuse Paternal Aunt    Cancer Paternal Aunt    Heart disease Half-Brother    Breast cancer Maternal Cousin    Brain cancer Maternal Cousin    Breast cancer Other 27   Leukemia Other 43   Colon polyps Neg Hx    Colon cancer Neg Hx    Esophageal cancer Neg Hx    Rectal cancer Neg Hx     ALLERGIES:  is allergic to minocycline, nitrofurantoin, and ketorolac tromethamine.  MEDICATIONS:  Current Outpatient Medications  Medication Sig Dispense Refill   fexofenadine (ALLEGRA) 180 MG tablet Take 180 mg by mouth daily. (Patient taking differently: Take  180 mg by mouth as needed for allergies.)     No current facility-administered medications for this visit.     PHYSICAL EXAMINATION: ECOG PERFORMANCE STATUS: 0 - Asymptomatic  Vitals:   09/10/24 1218  BP: 111/72  Pulse: 80  Resp: 18  Temp: 98.2 F (36.8 C)  SpO2: 98%   Filed Weights   09/10/24 1218  Weight: 222 lb 8 oz (100.9 kg)    GENERAL:alert, no distress and comfortable SKIN: skin color, texture, turgor are normal, no rashes or significant lesions EYES: normal, conjunctiva are pink and non-injected, sclera clear OROPHARYNX:no exudate, no erythema and lips, buccal mucosa,  and tongue normal  NECK: supple, thyroid normal size, non-tender, without nodularity LYMPH:  no palpable lymphadenopathy in the cervical, axillary  LUNGS: clear to auscultation and percussion with normal breathing effort HEART: regular rate & rhythm and no murmurs and no lower extremity edema ABDOMEN:abdomen soft, non-tender and normal bowel sounds Musculoskeletal:no cyanosis of digits and no clubbing  PSYCH: alert & oriented x 3 with fluent speech NEURO: no focal motor/sensory deficits  LABORATORY DATA:  I have reviewed the data as listed Lab Results  Component Value Date   WBC 6.2 08/05/2018   HGB 13.8 08/05/2018   HCT 43.2 08/05/2018   MCV 96.0 08/05/2018   PLT 280 08/05/2018     Chemistry      Component Value Date/Time   NA 138 08/05/2018 0134   K 3.6 08/05/2018 0134   CL 106 08/05/2018 0134   CO2 23 08/05/2018 0134   BUN 14 08/05/2018 0134   CREATININE 0.70 08/05/2018 0134   CREATININE 0.71 08/26/2012 1437      Component Value Date/Time   CALCIUM 9.2 08/05/2018 0134   ALKPHOS 78 08/21/2017 0745   AST 30 08/21/2017 0745   ALT 27 08/21/2017 0745   BILITOT 1.0 08/21/2017 0745       RADIOGRAPHIC STUDIES: I have personally reviewed the radiological images as listed and agreed with the findings in the report. US  RT BREAST BX W LOC DEV 1ST LESION IMG BX SPEC US  GUIDE Addendum  Date: 08/26/2024 ADDENDUM REPORT: 08/26/2024 13:21 ADDENDUM: PATHOLOGY revealed: Breast, right, needle core biopsy, 7 o'clock, 6 cmfn, coil clip- INVASIVE MODERATELY DIFFERENTIATED DUCTAL CARCINOMA, GRADE 2- OVERALL GRADE: 2- ANGIOLYMPHATIC INVASION NOT IDENTIFIED- TUMOR MEASURES 9 MM IN GREATEST LINEAR EXTENT Pathology results are CONCORDANT with imaging findings, per Dr. Curtistine Noble. Pathology results and recommendations below were discussed with patient by telephone. Patient reported biopsy site doing well with no adverse symptoms, and only slight tenderness at the site. Post biopsy care instructions were reviewed, questions were answered and my direct phone number was provided. Patient was instructed to call [facility] for any additional questions or concerns related to biopsy site. RECOMMENDATION: 1. Surgical and oncological consultation. Request for surgical consultation relayed to Olam Bunnell and Landry Finger at [facility]. 2. Consider pretreatment bilateral breast MRI to determine extent of breast disease. Pathology results reported by Mliss CHARM Molt RN 08/26/2024. Electronically Signed   By: Curtistine Noble   On: 08/26/2024 13:21   Result Date: 08/26/2024 CLINICAL DATA:  54 year old female presents for a right breast ultrasound-guided biopsy. EXAM: ULTRASOUND GUIDED RIGHT BREAST CORE NEEDLE BIOPSY COMPARISON:  Previous exam(s). PROCEDURE: The procedure was discussed with the patient including benefits and alternatives. We discussed the risks of the procedure, including infection, bleeding, tissue injury and inadequate sampling. Post biopsy clip placement and possible clip migration were also discussed. Informed written consent was given. The usual time-out protocol was performed immediately prior to the procedure. Right breast: The patient was scanned and the area of interest in the 7 o' clock position, 6 cm from the nipple, was localized which correlates with  the area of concern seen on prior imaging studies. This area was targeted for ultrasound guided core needle biopsy. After sterile skin prep and 1% lidocaine with and without epinephrine  for local anesthesia, a 14 gauge spring-loaded biopsy needle was used under direct ultrasound visualization to obtain several cores of tissue from the mass using a lateral to medial approach. Next, under ultrasound guidance, a coil shaped  clip was placed in the sampled mass. There were no immediate post-procedure complications. A follow up 2 view mammogram was performed and dictated separately. IMPRESSION: Ultrasound guided biopsy of the right breast as above. No apparent complications. Electronically Signed: By: Curtistine Noble On: 08/22/2024 09:56   MM CLIP PLACEMENT RIGHT Result Date: 08/22/2024 CLINICAL DATA:  54 year old female status post right breast ultrasound-guided biopsy. EXAM: 3D DIAGNOSTIC RIGHT MAMMOGRAM POST ULTRASOUND BIOPSY COMPARISON:  Previous exam(s). ACR Breast Density Category b: There are scattered areas of fibroglandular density. FINDINGS: 3D Mammographic images were obtained following ultrasound guided biopsy of the right breast at about the 7 o'clock position, approximately 6 cm from the nipple. The biopsy marking clip is in expected position at the site of biopsy. IMPRESSION: Appropriate positioning of the coil shaped biopsy marking clip at the site of biopsy in the right breast as above. Final Assessment: Post Procedure Mammograms for Marker Placement Electronically Signed   By: Curtistine Noble   On: 08/22/2024 10:10   MM 3D DIAGNOSTIC MAMMOGRAM UNILATERAL LEFT BREAST Result Date: 08/13/2024 CLINICAL DATA:  54 year old female recalled for a possible right breast mass and a possible left breast asymmetry. EXAM: DIGITAL DIAGNOSTIC UNILATERAL LEFT MAMMOGRAM WITH TOMOSYNTHESIS AND CAD; ULTRASOUND RIGHT BREAST LIMITED TECHNIQUE: Left digital diagnostic mammography and breast tomosynthesis was  performed. The images were evaluated with computer-aided detection. ; Targeted ultrasound examination of the right breast was performed Additional 2 and 3D spot compression views were obtained. COMPARISON:  Previous exam(s). ACR Breast Density Category b: There are scattered areas of fibroglandular density. FINDINGS: Left mammogram: Spot compression views demonstrate a 4 mm circumscribed mass with a lucent fatty hilum consistent with an intramammary lymph node about the outer breast, anterior to middle third. No findings suspicious for malignancy. Right breast ultrasound: Targeted imaging at about the 7 o'clock position, approximately 6 centimeters from the nipple, demonstrates an irregular hypoechoic mass with irregular margins and posterior acoustic shadowing measuring approximately 1.3 x 1.8 x 1.2 cm. This correlates favorably with the mammographic findings seen on the comparison mammogram. Additional imaging of the right axilla was unremarkable. IMPRESSION: Suspicious 1.8 cm right breast mass as above. RECOMMENDATION: Right breast ultrasound-guided biopsy. I have discussed the findings and recommendations with the patient. If applicable, a reminder letter will be sent to the patient regarding the next appointment. BI-RADS CATEGORY  4: Suspicious. Electronically Signed   By: Curtistine Noble   On: 08/13/2024 09:59   US  LIMITED ULTRASOUND INCLUDING AXILLA RIGHT BREAST Result Date: 08/13/2024 CLINICAL DATA:  54 year old female recalled for a possible right breast mass and a possible left breast asymmetry. EXAM: DIGITAL DIAGNOSTIC UNILATERAL LEFT MAMMOGRAM WITH TOMOSYNTHESIS AND CAD; ULTRASOUND RIGHT BREAST LIMITED TECHNIQUE: Left digital diagnostic mammography and breast tomosynthesis was performed. The images were evaluated with computer-aided detection. ; Targeted ultrasound examination of the right breast was performed Additional 2 and 3D spot compression views were obtained. COMPARISON:  Previous exam(s).  ACR Breast Density Category b: There are scattered areas of fibroglandular density. FINDINGS: Left mammogram: Spot compression views demonstrate a 4 mm circumscribed mass with a lucent fatty hilum consistent with an intramammary lymph node about the outer breast, anterior to middle third. No findings suspicious for malignancy. Right breast ultrasound: Targeted imaging at about the 7 o'clock position, approximately 6 centimeters from the nipple, demonstrates an irregular hypoechoic mass with irregular margins and posterior acoustic shadowing measuring approximately 1.3 x 1.8 x 1.2 cm. This correlates favorably with the mammographic findings seen on the comparison mammogram.  Additional imaging of the right axilla was unremarkable. IMPRESSION: Suspicious 1.8 cm right breast mass as above. RECOMMENDATION: Right breast ultrasound-guided biopsy. I have discussed the findings and recommendations with the patient. If applicable, a reminder letter will be sent to the patient regarding the next appointment. BI-RADS CATEGORY  4: Suspicious. Electronically Signed   By: Curtistine Noble   On: 08/13/2024 09:59    All questions were answered. The patient knows to call the clinic with any problems, questions or concerns. I spent 45 minutes in the care of this patient including H and P, review of records, counseling and coordination of care.     Amber Stalls, MD 09/10/2024 12:29 PM

## 2024-09-10 NOTE — Telephone Encounter (Signed)
 I spoke to Sherri Walton to review results of genetic testing. STAT panel negative, no mutations identified in the 9 genes included on this test. Results uploaded to labs tab. Full [facility] +RNA panel still running, results expected in 1-2 weeks. Will re-contact when available. All Sherri Walton questions answered at this time.   Updates to family history include maternal grandmother with uterine cancer. Niece did have genetic testing but unsure of results. Completed through [facility]. Niece will call to give permission for GC to access her result.

## 2024-09-11 ENCOUNTER — Ambulatory Visit (HOSPITAL_COMMUNITY)
Admission: RE | Admit: 2024-09-11 | Discharge: 2024-09-11 | Disposition: A | Discharge status: Home / Self Care | Source: Ambulatory Visit | Attending: Hematology and Oncology | Admitting: Hematology and Oncology

## 2024-09-11 DIAGNOSIS — Z17421 Hormone receptor negative with human epidermal growth factor receptor 2 negative status: Secondary | ICD-10-CM | POA: Diagnosis not present

## 2024-09-11 DIAGNOSIS — Z0189 Encounter for other specified special examinations: Secondary | ICD-10-CM

## 2024-09-11 DIAGNOSIS — Z01818 Encounter for other preprocedural examination: Secondary | ICD-10-CM | POA: Diagnosis present

## 2024-09-11 DIAGNOSIS — C50919 Malignant neoplasm of unspecified site of unspecified female breast: Secondary | ICD-10-CM | POA: Insufficient documentation

## 2024-09-11 LAB — ECHOCARDIOGRAM COMPLETE
Area-P 1/2: 4.54 cm2
Calc EF: 74.5 %
S' Lateral: 2.8 cm
Single Plane A2C EF: 74.4 %
Single Plane A4C EF: 70.4 %

## 2024-09-12 ENCOUNTER — Other Ambulatory Visit: Payer: Self-pay | Admitting: Surgery

## 2024-09-12 ENCOUNTER — Encounter: Payer: Self-pay | Admitting: General Practice

## 2024-09-12 DIAGNOSIS — Z853 Personal history of malignant neoplasm of breast: Secondary | ICD-10-CM

## 2024-09-12 NOTE — Progress Notes (Signed)
 SPIRITUAL CARE AND COUNSELING CONSULT NOTE   VISIT SUMMARY Referred by breast navigator Devere Collins/RN for additional layer of support. Sherri Walton by phone to establish care and build rapport. Adding her to [facility] support programming mailing lists per her request.   SPIRITUAL ENCOUNTER                                                                                                                                                                      Type of Visit: Initial Care provided to:: Patient Referral source: Nurse (RN/NT/LPN) Reason for visit: Routine spiritual support  SPIRITUAL FRAMEWORK  Presenting Themes: Meaning/purpose/sources of inspiration, Values and beliefs, Coping tools, Courage hope and growth, Community and relationships (role of art and creativity in reflection and healing) Values/beliefs: Faith as central source of meaning, purpose, and coping--family and community key as well Community/Connection: Family, Friend(s), Faith community Strengths: Upbeat, prayerful, centered, equipped with coping tools (prayer, church community, networking through support groups, love for art) Needs/Challenges/Barriers: Engineer, 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 Patient Stress Factors: Health changes   GOALS   Self/Personal Goals: Join support groups and programming Clinical Care Goals: Follow for support, esp with opportunity to meet in person during treatment   INTERVENTIONS   Spiritual Care Interventions Made: Established relationship of care and support, Compassionate presence, Reflective listening, Normalization of emotions, Explored values/beliefs/practices/strengths, Meaning making, Prayer, Encouragement   INTERVENTION OUTCOMES   Outcomes: Connection to spiritual care, Awareness around self/spiritual resourses  SPIRITUAL CARE PLAN   Spiritual Care Issues Still Outstanding: Chaplain will continue to follow Recommendations for Clinical Staff: Walton  values a deep sense of community and connection Follow up plan : Plan to follow up by phone on 09/22/2024 for prayer and support prior to procedures that week   Chaplain Olam Filiberto Lemming, [facility] Pager [phone] Voicemail [phone]

## 2024-09-15 ENCOUNTER — Encounter: Payer: Self-pay | Admitting: *Deleted

## 2024-09-15 ENCOUNTER — Institutional Professional Consult (permissible substitution)

## 2024-09-15 ENCOUNTER — Inpatient Hospital Stay: Admitting: Licensed Clinical Social Worker

## 2024-09-15 ENCOUNTER — Ambulatory Visit: Payer: Self-pay | Admitting: Genetic Counselor

## 2024-09-15 DIAGNOSIS — Z1379 Encounter for other screening for genetic and chromosomal anomalies: Secondary | ICD-10-CM | POA: Insufficient documentation

## 2024-09-15 NOTE — Progress Notes (Signed)
 HPI:  Sherri Walton was previously seen in the [facility] due to a personal and family history of cancer and concerns regarding a hereditary predisposition to cancer. Please refer to our prior cancer genetics clinic note for more information regarding our discussion, assessment and recommendations, at the time. Sherri Walton recent genetic test results were disclosed to her, as were recommendations warranted by these results. These results and recommendations are discussed in more detail below.  Results were disclosed by telephone on 09/15/24.   CANCER HISTORY:  Oncology History  Breast cancer of lower-outer quadrant of right female breast (HCC)  09/09/2024 Initial Diagnosis   Breast cancer of lower-outer quadrant of right female breast (HCC)   09/09/2024 Cancer Staging   Staging form: Breast, AJCC 8th Edition - Clinical stage from 09/09/2024: Stage IB (cT1c, cN0, cM0, G2, ER-, PR-, HER2-) - Signed by Izell Domino, MD on 09/09/2024 Stage prefix: Initial diagnosis Histologic grading system: 3 grade system     FAMILY HISTORY:  We obtained a detailed, 4-generation family history.  Significant diagnoses are listed below: Family History  Problem Relation Age of Onset   Hypertension Mother    Diabetes Mother    Gout Mother    Liver cancer Father    Alcohol abuse Father    Stomach cancer Father 73   Uterine cancer Maternal Grandmother    Lung cancer Maternal Aunt    Lung cancer Paternal Aunt    Alcohol abuse Paternal Aunt    Cancer Paternal Aunt    Heart disease Half-Brother    Breast cancer Maternal Cousin    Brain cancer Maternal Cousin    Breast cancer Other 27   Leukemia Other 43   Colon polyps Neg Hx    Colon cancer Neg Hx    Esophageal cancer Neg Hx    Rectal cancer Neg Hx    Sherri Walton is unaware of previous family history of genetic testing for hereditary cancer risks. There is no reported Ashkenazi Jewish ancestry. She reports her niece diagnosed with breast  cancer has had genetic testing, but is unsure of her results. She reports her maternal grandmother was diagnosed with uterine cancer.       GENETIC TEST RESULTS: Genetic testing reported out on 09/11/24 through the [facility] +RNA panel cancer panel found no pathogenic mutations. The [facility] panel includes analysis of the following 70 genes:AIP, ALK, APC, ATM, AXIN2, BAP1, BARD1, BLM, BMPR1A, BRCA1, BRCA2, BRIP1, CDC73, CDH1, CDK4, CDKN1B, CDKN2A, CHEK2, CTNNA1, DICER1, EGFR, EPCAM, FH, FLCN, GREM1, HOXB13, KIT, LZTR1, MAX, MBD4, MEN1, MET, MITF, MLH1, MSH2, MSH6, MUTYH, NF1, NF2, NTHL1, PALB2, PDGFRA, PMS2, POLD1, POLE, POT1, PRKAR1A, PTCH1, PTEN, RAD51C, RAD51D, RB1, RET, SDHA, SDHAF2, SDHB, SDHC, SDHD, SMAD4, SMARCA4, SMARCB1, SMARCE1, STK11, SUFU, TMEM127, TP53, TSC1, TSC2, VHL. RNA analysis was included for applicable genes. The test report has been scanned into EPIC and is located under the Molecular Pathology section of the Results Review tab.  A portion of the result report is included below for reference.     We discussed with Sherri Walton that because current genetic testing is not perfect, it is possible there may be a gene mutation in one of these genes that current testing cannot detect, but that chance is small.  We also discussed, that there could be another gene that has not yet been discovered, or that we have not yet tested, that is responsible for the cancer diagnoses in the family. It is also possible there is a  hereditary cause for the cancer in the family that Sherri Walton did not inherit and therefore was not identified in her testing.  Therefore, it is important to remain in touch with cancer genetics in the future so that we can continue to offer Sherri Walton the most up to date genetic testing.   ADDITIONAL GENETIC TESTING: We discussed with Sherri Walton that her genetic testing was fairly extensive.  If there are genes identified to increase cancer risk that can be  analyzed in the future, we would be happy to discuss and coordinate this testing at that time.    CANCER SCREENING RECOMMENDATIONS: Sherri Walton test result is considered negative (normal).  This means that we have not identified a hereditary cause for her personal and family history of cancer at this time. Most cancers happen by chance and this negative test suggests that her personal and family history of cancer may fall into this category.    Possible reasons for Sherri Walton negative genetic test include:  1. There may be a gene mutation in one of these genes that current testing methods cannot detect. The likelihood of this is low, but possible.   2. There could be another gene that has not yet been discovered, or that we have not yet tested, that is responsible for the cancer diagnoses in the family.  3.  There may be no hereditary risk for cancer in the family. The cancers in Sherri Walton and/or her family may be sporadic/familial or due to other genetic and environmental factors. 4. It is also possible there is a hereditary cause for the cancer in the family that Sherri Walton did not inherit.  Therefore, it is recommended she continue to follow the cancer management and screening guidelines provided by her oncology and primary healthcare provider. An individual's cancer risk and medical management are not determined by genetic test results alone. Overall cancer risk assessment incorporates additional factors, including personal medical history, family history, and any available genetic information that may result in a personalized plan for cancer prevention and surveillance  Given Sherri Walton personal and family histories, we must interpret these negative results with some caution.  Families with features suggestive of hereditary risk for cancer tend to have multiple family members with cancer, diagnoses in multiple generations and diagnoses before the age of 65. Sherri Walton family exhibits some of these  features. Thus, this result may simply reflect our current inability to detect all mutations within these genes or there may be a different gene that has not yet been discovered or tested.   RECOMMENDATIONS FOR FAMILY MEMBERS:  Individuals in this family might be at some increased risk of developing cancer, over the general population risk, simply due to the family history of cancer.  We recommended women in this family have a yearly mammogram beginning at age 58, or 39 years younger than the earliest onset of cancer, an annual clinical breast exam, and perform monthly breast self-exams. Women in the family may qualify for increased breast cancer surveillance or to initiate surveillance at younger ages and should discuss these options with their healthcare providers. Women in this family should also have a gynecological exam as recommended by their primary provider. All family members should be referred for colonoscopy starting at age 70, or 8 years younger than the earliest onset of cancer.  Other relatives may benefit from completing their own genetic testing, especially if they have been diagnosed with cancer.   FOLLOW-UP: Lastly, we discussed with Sherri Walton  that cancer genetics is a rapidly advancing field and it is possible that new genetic tests will be appropriate for her and/or her family members in the future. We encouraged her to remain in contact with cancer genetics on an annual basis so we can update her personal and family histories and let her know of advances in cancer genetics that may benefit this family.   Our contact number was provided. Sherri Walton questions were answered to her satisfaction, and she knows she is welcome to call us  at anytime with additional questions or concerns.   Burnard Ogren, MS, [facility] Licensed, [email or web address] [phone]

## 2024-09-15 NOTE — Progress Notes (Signed)
 [facility] Clinical Social Work  Clinical Social Work was referred by statistician for financial concerns.  Clinical Social Worker contacted patient by phone to offer support and assess for needs.    Patient is stressed financially as she is not currently working due to cancer diagnosis and upcoming treatment.  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 Pt brings mom to a day center in [place] during the day.  Currently, mom receives LIEAP. Pt's SNAP expired in 07/2024, but she will re-apply.   Interventions: Referred patient to community resources: Devere Sherri Walton, [employer]  Provided information on Medicaid transportation for rides and/or gas reimbursement Provided information on other cancer foundations for financial assistance      Follow Up Plan:  Patient will come in tomorrow to complete additional applications for financial assistance    Sherri Anselmo E Jasani Dolney, LCSW  Clinical Social Worker [facility]        Patient is participating in a Managed Medicaid Plan:  Yes

## 2024-09-15 NOTE — Progress Notes (Signed)
 Per genetics counselor patient had some questions about appts. Called patient and talked to her for about 20 minutes. Explained appts to her and who she should be expecting a call from for other appts. She had questions about when chemo would begin and I told her that she will be back to see Dr. Loretha  about 2-3 weeks post op and at that time they will discuss chemo.  Patient has financial concerns and I relayed these to General dynamics who will contact patient. Faith is very important to patient and she stated she enjoyed talking to our chaplain Olam. Patient has my phone number for any future needs.

## 2024-09-16 ENCOUNTER — Inpatient Hospital Stay: Admitting: Licensed Clinical Social Worker

## 2024-09-16 NOTE — Progress Notes (Signed)
 [facility] CSW Progress Note  Visual Merchandiser met with patient to follow-up on financial concerns.    Interventions: Completed applications for [employer] and [facility] Worked on application for [employer]- patient will send bills and lease and then CSW will submit application Provided bag from [employer] Provided letter for patient to use for SNAP application      Follow Up Plan:  Patient will contact CSW with any support or resource needs and will send bills needed for [employer] application    Sueko Dimichele E Andreia Gandolfi, LCSW Clinical Social Worker [facility]    Patient is participating in a Managed Medicaid Plan:  Yes

## 2024-09-16 NOTE — Pre-Procedure Instructions (Addendum)
 Surgical Instructions   Your procedure is scheduled on Thursday, January 29th.  Report to [facility] at 1235 P.M., then check in with the Admitting office. Any questions or running late day of surgery: call [phone]  Questions prior to your surgery date: call [phone], Monday-Friday, 8am-4pm. If you experience any cold or flu symptoms such as cough, fever, chills, shortness of breath, etc. between now and your scheduled surgery, please notify us  at the above number.     Remember:  Do not eat after midnight the night before your surgery   You may drink clear liquids until 1135 the morning of your surgery.   Clear liquids allowed are: Water, Non-Citrus Juices (without pulp), Carbonated Beverages, Clear Tea (no milk, honey, etc.), Black Coffee Only (NO MILK, CREAM OR POWDERED CREAMER of any kind), and Gatorade.  Patient Instructions  The night before surgery:  No food after midnight. ONLY clear liquids after midnight  The day of surgery (if you do NOT have diabetes):  Drink ONE (1) Pre-Surgery Clear Ensure by 1135 the morning of surgery. Drink in one sitting. Do not sip.  This drink was given to you during your hospital  pre-op appointment visit.  Nothing else to drink after completing the  Pre-Surgery Clear Ensure.          If you have questions, please contact your surgeons office.     Take these medicines the morning of surgery with A SIP OF WATER: none   May take these medicines IF NEEDED: Albuterol  inhaler  fexofenadine (ALLEGRA)  raMADol (ULTRAM )  One week prior to surgery, STOP taking any Aspirin (unless otherwise instructed by your surgeon) Aleve, Naproxen, Ibuprofen , Motrin , Advil , Goody's, BC's, all herbal medications, fish oil, and non-prescription vitamins.                     Do NOT Smoke (Tobacco/Vaping) for 24 hours prior to your procedure.  If you use a CPAP at night, you may bring your mask/headgear for your overnight stay.    You will be asked to remove any contacts, glasses, piercing's, hearing aid's, dentures/partials prior to surgery. Please bring cases for these items if needed.    Your surgeon will determine if you are to be admitted or discharged the same day.  Patients discharged the day of surgery will not be allowed to drive home, and someone needs to stay with them for 24 hours.  SURGICAL WAITING ROOM VISITATION Patients may have no more than 2 support people in the waiting area - these visitors may rotate.   Pre-op nurse will coordinate an appropriate time for 2 ADULT support persons, who may not rotate, to accompany patient in pre-op.  Children under the age of 86 must have an adult with them who is not the patient and must remain in the main waiting area with an adult.  If the patient needs to stay at the hospital during part of their recovery, the visitor guidelines for inpatient rooms apply.  Please refer to the [facility] website for the visitor guidelines for any additional information.   If you received a COVID test during your pre-op visit  it is requested that you wear a mask when out in public, stay away from anyone that may not be feeling well and notify your surgeon if you develop symptoms. If you have been in contact with anyone that has tested positive in the last 10 days please notify you surgeon.  Pre-operative CHG Bathing Instructions   You can play a key role in reducing the risk of infection after surgery. Your skin needs to be as free of germs as possible. You can reduce the number of germs on your skin by washing with CHG (chlorhexidine gluconate) soap before surgery. CHG is an antiseptic soap that kills germs and continues to kill germs even after washing.   DO NOT use if you have an allergy to chlorhexidine/CHG or antibacterial soaps. If your skin becomes reddened or irritated, stop using the CHG and notify one of our RNs at [phone].              TAKE A SHOWER THE  NIGHT BEFORE SURGERY   Please keep in mind the following:  DO NOT shave, including legs and underarms, 48 hours prior to surgery.   You may shave your face before/day of surgery.  Place clean sheets on your bed the night before surgery Use a clean washcloth (not used since being washed) for shower. DO NOT sleep with pet's night before surgery.  CHG Shower Instructions:  Wash your face and private area with normal soap. If you choose to wash your hair, wash first with your normal shampoo.  After you use shampoo/soap, rinse your hair and body thoroughly to remove shampoo/soap residue.  Turn the water OFF and apply half the bottle of CHG soap to a CLEAN washcloth.  Apply CHG soap ONLY FROM YOUR NECK DOWN TO YOUR TOES (washing for 3-5 minutes)  DO NOT use CHG soap on face, private areas, open wounds, or sores.  Pay special attention to the area where your surgery is being performed.  If you are having back surgery, having someone wash your back for you may be helpful. Wait 2 minutes after CHG soap is applied, then you may rinse off the CHG soap.  Pat dry with a clean towel  Put on clean pajamas    Additional instructions for the day of surgery: If you choose, you may shower the morning of surgery with an antibacterial soap.  DO NOT APPLY any lotions, deodorants, cologne, or perfumes.   Do not wear jewelry or makeup Do not wear nail polish, gel polish, artificial nails, or any other type of covering on natural nails (fingers and toes) Do not bring valuables to the hospital. [facility] is not responsible for valuables/personal belongings. Put on clean/comfortable clothes.  Please brush your teeth.  Ask your nurse before applying any prescription medications to the skin.

## 2024-09-17 ENCOUNTER — Encounter: Payer: Self-pay | Admitting: Licensed Clinical Social Worker

## 2024-09-17 ENCOUNTER — Inpatient Hospital Stay (HOSPITAL_COMMUNITY)
Admission: RE | Admit: 2024-09-17 | Discharge: 2024-09-17 | Disposition: A | Discharge status: Home / Self Care | Source: Ambulatory Visit

## 2024-09-17 NOTE — Progress Notes (Signed)
 Pt no-show for 10 am appointment scheduled for Wednesday 09/17/24, called pt and rescheduled appointment for Thursday 09/18/24 @ 8 am, informed pt to check in with admit at 0745 am.

## 2024-09-17 NOTE — Progress Notes (Signed)
 [facility] Clinical Social Work  Received final documents from patient for [employer]. Mailed full [employer] application today on [employer].  Pt also went to [employer] to do her application as well as receiving a wig, some hats, and other supplies.    Sutton Plake E Socrates Cahoon, LCSW

## 2024-09-18 ENCOUNTER — Encounter (HOSPITAL_COMMUNITY)
Admission: RE | Admit: 2024-09-18 | Discharge: 2024-09-18 | Disposition: A | Discharge status: Home / Self Care | Source: Ambulatory Visit | Attending: Orthopaedic Surgery | Admitting: Orthopaedic Surgery

## 2024-09-18 ENCOUNTER — Encounter (HOSPITAL_COMMUNITY): Payer: Self-pay | Admitting: Emergency Medicine

## 2024-09-18 ENCOUNTER — Telehealth: Payer: Self-pay | Admitting: Hematology and Oncology

## 2024-09-18 ENCOUNTER — Other Ambulatory Visit: Payer: Self-pay

## 2024-09-18 VITALS — BP 121/87 | HR 67 | Temp 98.0°F | Resp 17 | Ht 67.0 in | Wt 223.1 lb

## 2024-09-18 DIAGNOSIS — Z01810 Encounter for preprocedural cardiovascular examination: Secondary | ICD-10-CM | POA: Diagnosis present

## 2024-09-18 DIAGNOSIS — E785 Hyperlipidemia, unspecified: Secondary | ICD-10-CM | POA: Diagnosis not present

## 2024-09-18 DIAGNOSIS — C50911 Malignant neoplasm of unspecified site of right female breast: Secondary | ICD-10-CM | POA: Diagnosis not present

## 2024-09-18 DIAGNOSIS — Z01812 Encounter for preprocedural laboratory examination: Secondary | ICD-10-CM | POA: Diagnosis present

## 2024-09-18 DIAGNOSIS — Z01818 Encounter for other preprocedural examination: Secondary | ICD-10-CM | POA: Diagnosis not present

## 2024-09-18 LAB — CBC
HCT: 40.2 % (ref 36.0–46.0)
Hemoglobin: 13.3 g/dL (ref 12.0–15.0)
MCH: 30.8 pg (ref 26.0–34.0)
MCHC: 33.1 g/dL (ref 30.0–36.0)
MCV: 93.1 fL (ref 80.0–100.0)
Platelets: 261 K/uL (ref 150–400)
RBC: 4.32 MIL/uL (ref 3.87–5.11)
RDW: 12.6 % (ref 11.5–15.5)
WBC: 4.2 K/uL (ref 4.0–10.5)
nRBC: 0 % (ref 0.0–0.2)

## 2024-09-18 NOTE — Telephone Encounter (Signed)
 I spoke with patient and she is aware of MD appointment on 10/14/2024 per staff message.

## 2024-09-18 NOTE — Progress Notes (Signed)
 PCP -  Dr. Lorrene Daisy Cardiologist - denies  PPM/ICD - denies Device Orders - n/a Rep Notified - n/a  Chest x-ray - 10-03-22 EKG - 09-18-24 Stress Test - denies ECHO - 09-11-24 Cardiac Cath - denies  Sleep Study - denies CPAP - n/a  Fasting Blood Sugar - No DM Checks Blood Sugar _____ times a day  Last dose of GLP1 agonist-  n/a GLP1 instructions: n/a  Blood Thinner Instructions: denies Aspirin Instructions: denies  ERAS Protcol - ERAS w/drink PRE-SURGERY Ensure or G2- Ensure  COVID TEST- n/a   Anesthesia review: Breast seed placement 09-23-24  Patient denies shortness of breath, fever, cough and chest pain at PAT appointment   All instructions explained to the patient, with a verbal understanding of the material. Patient agrees to go over the instructions while at home for a better understanding. Patient also instructed to self quarantine after being tested for COVID-19. The opportunity to ask questions was provided.

## 2024-09-19 NOTE — Anesthesia Preprocedure Evaluation (Signed)
 "                                  Anesthesia Evaluation  Patient identified by MRN, date of birth, ID band Patient awake    Reviewed: Allergy & Precautions, NPO status , Patient's Chart, lab work & pertinent test results  History of Anesthesia Complications Negative for: history of anesthetic complications  Airway Mallampati: III  TM Distance: >3 FB Neck ROM: Full    Dental  (+) Partial Upper   Pulmonary neg pulmonary ROS   Pulmonary exam normal breath sounds clear to auscultation       Cardiovascular (-) hypertension(-) angina (-) Past MI, (-) Cardiac Stents and (-) CABG (-) dysrhythmias  Rhythm:Regular  HLD  TTE 09/11/2024: IMPRESSIONS    1. Left ventricular ejection fraction, by estimation, is 60 to 65%. The  left ventricle has normal function. The left ventricle has no regional  wall motion abnormalities. Left ventricular diastolic parameters were  normal. The average left ventricular  global longitudinal strain is -16.6 %. The global longitudinal strain is  abnormal.   2. Right ventricular systolic function is normal. The right ventricular  size is normal.   3. The mitral valve is normal in structure. No evidence of mitral valve  regurgitation. No evidence of mitral stenosis.   4. The aortic valve is normal in structure. Aortic valve regurgitation is  not visualized. No aortic stenosis is present.   5. The inferior vena cava is normal in size with greater than 50%  respiratory variability, suggesting right atrial pressure of 3 mmHg.     Neuro/Psych  Headaches, neg Seizures    GI/Hepatic negative GI ROS, Neg liver ROS,,,  Endo/Other  negative endocrine ROS    Renal/GU negative Renal ROS     Musculoskeletal  (+) Arthritis ,    Abdominal  (+) + obese  Peds  Hematology negative hematology ROS (+) Lab Results      Component                Value               Date                      WBC                      4.2                 09/18/2024                 HGB                      13.3                09/18/2024                HCT                      40.2                09/18/2024                MCV                      93.1  09/18/2024                PLT                      261                 09/18/2024              Anesthesia Other Findings   Reproductive/Obstetrics Right breast cancer                              Anesthesia Physical Anesthesia Plan  ASA: 2  Anesthesia Plan: General   Post-op Pain Management: Regional block* and Tylenol  PO (pre-op)*   Induction: Intravenous  PONV Risk Score and Plan: 3 and Ondansetron , Dexamethasone , Treatment may vary due to age or medical condition and Midazolam   Airway Management Planned: LMA  Additional Equipment:   Intra-op Plan:   Post-operative Plan: Extubation in OR  Informed Consent: I have reviewed the patients History and Physical, chart, labs and discussed the procedure including the risks, benefits and alternatives for the proposed anesthesia with the patient or authorized representative who has indicated his/her understanding and acceptance.     Dental advisory given  Plan Discussed with: CRNA and Anesthesiologist  Anesthesia Plan Comments: (PAT note written 09/19/2024 by Idelia Caudell, PA-C.  Discussed potential risks of nerve blocks including, but not limited to, infection, bleeding, nerve damage, seizures, pneumothorax, respiratory depression, and potential failure of the block. Alternatives to nerve blocks discussed. All questions answered.  Risks of general anesthesia discussed including, but not limited to, sore throat, hoarse voice, chipped/damaged teeth, injury to vocal cords, nausea and vomiting, allergic reactions, lung infection, heart attack, stroke, and death. All questions answered.  )         Anesthesia Quick Evaluation  "

## 2024-09-19 NOTE — Progress Notes (Signed)
 Anesthesia Chart Review:  Case: [id number] Date/Time: 09/25/24 1407   Procedures:      BREAST LUMPECTOMY WITH RADIOACTIVE SEED AND SENTINEL LYMPH NODE BIOPSY (Right: Breast) - RADIOACTIVE SEED GUIDED RIGHT BREAST LUMPECTOMY WITH SENTINEL NODE BIOPSY     INSERTION, TUNNELED CENTRAL VENOUS DEVICE, WITH PORT - PORT PLACEMENT WITH ULTRASOUND GUIDANCE   Anesthesia type: General   Pre-op diagnosis: RIGHT BREAST CANCER   Location: [facility] ROOM 08 / [facility]   Surgeons: Vernetta Berg, MD       DISCUSSION: Patient is a 54 year old female scheduled for the above procedure.  History includes never smoker, HLD, seasonal allergies, arthritis, right breast cancer (IDC 08/22/2024).   RSL is scheduled for 09/23/2024.  Postmenopausal.  CBC normal.  Anesthesia team to evaluate on the day of surgery.  VS: BP 121/87   Pulse 67   Temp 36.7 C   Resp 17   Ht 5' 7 (1.702 m)   Wt 101.2 kg   LMP 01/14/2019   SpO2 100%   BMI 34.94 kg/m  Postmenopausal.  PROVIDERS: Clois Lorrene HERO, MD is PCP  Loretha Ash, MD is HEM-ONC Izell Domino, MD is RAD-ONC   LABS: Preoperative labs noted.  CBC was normal. (all labs ordered are listed, but only abnormal results are displayed)  Labs Reviewed  CBC    EKG: 09/18/2024: Normal sinus rhythm Nonspecific T wave abnormality Abnormal ECG Confirmed by Barbaraann Kotyk ([id number]) on 09/18/2024 5:39:28 P   CV: Echo 09/11/2024: Sonographer Comments: Global longitudinal strain was attempted.  IMPRESSIONS   1. Left ventricular ejection fraction, by estimation, is 60 to 65%. The  left ventricle has normal function. The left ventricle has no regional  wall motion abnormalities. Left ventricular diastolic parameters were  normal. The average left ventricular  global longitudinal strain is -16.6 %. The global longitudinal strain is  abnormal.   2. Right ventricular systolic function is normal. The right ventricular  size is normal.   3. The mitral valve is normal in  structure. No evidence of mitral valve  regurgitation. No evidence of mitral stenosis.   4. The aortic valve is normal in structure. Aortic valve regurgitation is  not visualized. No aortic stenosis is present.   5. The inferior vena cava is normal in size with greater than 50%  respiratory variability, suggesting right atrial pressure of 3 mmHg.    Past Medical History:  Diagnosis Date   Allergy    seasonal allergies   Arthritis 2024   Per pt  In back   Headache 1999   Per pt  treated in past for migraines   HLD (hyperlipidemia)    diet controlled   Pneumonia 2024    Past Surgical History:  Procedure Laterality Date   BREAST BIOPSY Left 12/15/2022   US  LT BREAST BX W LOC DEV 1ST LESION IMG BX SPEC US  GUIDE 12/15/2022 [facility]   BREAST BIOPSY Right 08/22/2024   US  RT BREAST BX W LOC DEV 1ST LESION IMG BX SPEC US  GUIDE 08/22/2024 [facility]   CESAREAN SECTION  1991   TUBAL LIGATION  2001   wisdom teeth  1989    MEDICATIONS:  albuterol  (VENTOLIN  HFA) 108 (90 Base) MCG/ACT inhaler   Aspirin-Caffeine (BC FAST PAIN RELIEF PO)   CRANBERRY PO   ELDERBERRY PO   fexofenadine (ALLEGRA) 180 MG tablet   Multiple Vitamin (MULTIVITAMIN WITH MINERALS) TABS tablet   traMADol  (ULTRAM ) 50 MG tablet   No current facility-administered medications for this encounter.  Isaiah Ruder, PA-C Surgical Short Stay/Anesthesiology [facility] Phone [phone] [facility] Phone [phone] 09/19/2024 4:44 PM

## 2024-09-22 ENCOUNTER — Encounter: Payer: Self-pay | Admitting: General Practice

## 2024-09-22 NOTE — Progress Notes (Signed)
 SPIRITUAL CARE AND COUNSELING CONSULT NOTE   VISIT SUMMARY  Followed up for prayer and encouragement prior to surgery later this week per Ms Jay's request. Spoke in detail about talking with families, including grown children and young grandchildren, about diagnosis, treatment, and sources of hope/comfort/reassurance.  SPIRITUAL ENCOUNTER                                                                                                                                                                      Type of Visit: Follow up Care provided to:: Patient Referral source: Patient request Reason for visit: Routine spiritual support  SPIRITUAL FRAMEWORK  Presenting Themes: Values and beliefs, Meaning/purpose/sources of inspiration, Caregiving needs, Courage hope and growth Values/beliefs: Faith, family, church, and friends as core sources of meaning Community/Connection: Family, Friend(s), Faith community Strengths: Reflecting on her faith and her need to focus on her own wellbeing   GOALS   Self/Personal Goals: Speak with chaplain for prayer/blessing day of surgery Clinical Care Goals: Provide touchstones of spiritual support for continued encouragement   INTERVENTIONS   Spiritual Care Interventions Made: Compassionate presence, Reflective listening, Normalization of emotions, Explored ethical dilemma, Prayer, Encouragement   INTERVENTION OUTCOMES   Outcomes: Autonomy/agency, Connection to values and goals of care, Awareness of support  SPIRITUAL CARE PLAN   Spiritual Care Issues Still Outstanding: Chaplain will continue to follow Follow up plan : Plan to phone morning of 1/29, prior to surgery, per request    Chaplain Olam Corrigan, MDiv, [facility] Pager [phone] Voicemail [phone]

## 2024-09-23 ENCOUNTER — Other Ambulatory Visit: Payer: Self-pay | Admitting: Surgery

## 2024-09-23 ENCOUNTER — Ambulatory Visit
Admission: RE | Admit: 2024-09-23 | Discharge: 2024-09-23 | Disposition: A | Source: Ambulatory Visit | Attending: Surgery | Admitting: Surgery

## 2024-09-23 DIAGNOSIS — Z853 Personal history of malignant neoplasm of breast: Secondary | ICD-10-CM

## 2024-09-24 NOTE — H&P (Signed)
 " REFERRING PHYSICIAN: Clois Bertrum Pop* PROVIDER: VICENTA DASIE POLI, MD MRN: [id number] DOB: [birth year] DATE OF ENCOUNTER: 09/02/2024 Subjective   Chief Complaint: New Consultation (NEW BREAST CANCER - Rt breast cancer,)  History of Present Illness: Sherri Walton is a 54 y.o. female who is seen today as an office consultation for evaluation of New Consultation (NEW BREAST CANCER - Rt breast cancer,)  This is a 54 year old female who was found to have a mass on screening mammography in the right breast. She had had a previous benign biopsy in the left breast for fibrocystic changes 2 years prior. She underwent further imaging showing a 1.3 x 1.8 x 1.2 cm mass at the 7 o'clock position of the right breast 6-minute from the nipple. 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 Ultrasound the axilla was negative. She denies nipple discharge. There is a family history of breast cancer and a niece on her father side. She has no cardiopulmonary issues and is otherwise without complaints.  Review of Systems: A complete review of systems was obtained from the patient. I have reviewed this information and discussed as appropriate with the patient. See HPI as well for other ROS.  ROS   Medical History: Past Medical History:  Diagnosis Date  Hyperlipidemia   There is no problem list on file for this patient.  Past Surgical History:  Procedure Laterality Date  CESAREAN SECTION 1991  LAPAROSCOPIC TUBAL LIGATION 2001  Breast biopsy (Left) 12/15/2022  Breast biopsy (Right) 08/22/2024    Allergies  Allergen Reactions  Ketorolac Itching  Minocycline Swelling  angioedema  Nitrofurantoin Itching and Swelling  angioedema   Current Outpatient Medications on File Prior to Visit  Medication Sig Dispense Refill  fexofenadine (ALLEGRA) 180 MG tablet Take 180 mg by mouth once daily (Patient not taking: Reported on 09/02/2024)  fluticasone propionate  (FLONASE) 50 mcg/actuation nasal spray Place 2 sprays into both nostrils once daily (Patient not taking: Reported on 09/02/2024)  hydrocortisone (ANUSOL-HC) 2.5 % rectal cream Apply rectally 2 times daily (Patient not taking: Reported on 09/02/2024)  sulfamethoxazole-trimethoprim (BACTRIM DS) 800-160 mg tablet Take 1 tablet by mouth 2 (two) times daily (Patient not taking: Reported on 09/02/2024)  traMADoL  (ULTRAM ) 50 mg tablet (Patient not taking: Reported on 09/02/2024)   No current facility-administered medications on file prior to visit.   Family History  Problem Relation Age of Onset  Gout Mother  High blood pressure (Hypertension) Mother  Diabetes Mother  Liver cancer Father  Alcohol abuse Father  Heart disease Brother  Alcohol abuse Paternal Aunt  Breast cancer Other  Colon polyps Neg Hx  Esophageal cancer Neg Hx  Stomach cancer Neg Hx  Rectal cancer Neg Hx  Colon cancer Neg Hx    Social History   Tobacco Use  Smoking Status Never  Smokeless Tobacco Never    Social History   Socioeconomic History  Marital status: Single  Tobacco Use  Smoking status: Never  Smokeless tobacco: Never  Vaping Use  Vaping status: Never Used  Substance and Sexual Activity  Alcohol use: Never  Drug use: Never   Social Drivers of Corporate Investment Banker Strain: Low Risk (02/07/2024)  Received from [facility]  Overall Financial Resource Strain (CARDIA)  Difficulty of Paying Living Expenses: Not hard at all  Food Insecurity: No Food Insecurity (02/07/2024)  Received from [facility]  Hunger Vital Sign  Within the past 12 months, you worried that your food would run out before  you got the money to buy more.: Never true  Within the past 12 months, the food you bought just didn't last and you didn't have money to get more.: Never true  Transportation Needs: No Transportation Needs (02/07/2024)  Received from [facility] - Transportation  Lack of Transportation (Medical):  No  Lack of Transportation (Non-Medical): No  Housing Stability: Low Risk (02/07/2024)  Received from [facility] Stability Vital Sign  In the last 12 months, was there a time when you were not able to pay the mortgage or rent on time?: No  In the past 12 months, how many times have you moved where you were living?: 1  At any time in the past 12 months, were you homeless or living in a shelter (including now)?: No   Objective:   Vitals:  09/02/24 1449  BP: 115/78  Pulse: 89  Temp: 36.6 C (97.9 F)  TempSrc: Temporal  SpO2: 97%  Weight: (!) 102.1 kg (225 lb)  Height: 182.9 cm (6')  PainSc: 0-No pain   Body mass index is 30.52 kg/m.  Physical Exam   She appears well on exam  There are no palpable masses in either breast. There is ecchymosis in the lower outer quadrant of the right breast from her biopsy and there may be a small underlying hematoma. 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 I discussed the multidisciplinary approach to breast cancer. From a surgical standpoint we discussed breast conservation with a lumpectomy versus mastectomy. She is actually interested in bilateral mastectomies given the diagnosis of a triple negative breast cancer. I also discussed the possibility for the need for chemotherapy and a Port-A-Cath insertion as well. We also discussed sentinel lymph node biopsy at the time of any surgery. I explained all surgical procedures in detail. We discussed the risks of all the procedures which include but are not limited to bleeding, infection, injury to surrounding structures, chronic lymphedema, seroma formation, Port-A-Cath malfunction,  pneumothorax, the need for further procedures, excetra. We will refer her to be seen a soon as possible at the cancer center. We have requested medical oncology, radiation oncology, genetics, and physical therapy evaluation. We will also refer her to plastic surgeons as she is considering immediate reconstruction. Again, she understands that medical oncology may recommend neoadjuvant chemotherapy and Port-A-Cath insertion prior to any further surgery. She agrees with our current plans.  ADDENDUM: After meeting with medical oncology and plastic surgery, she has decided to proceed with a radioactive seed guided right breast lumpectomy, sentinel node biopsy and port-a-cath insertion which is being scheduled   "

## 2024-09-25 ENCOUNTER — Encounter (HOSPITAL_COMMUNITY): Payer: Self-pay | Admitting: Surgery

## 2024-09-25 ENCOUNTER — Ambulatory Visit (HOSPITAL_COMMUNITY)

## 2024-09-25 ENCOUNTER — Ambulatory Visit (HOSPITAL_COMMUNITY)
Admission: RE | Admit: 2024-09-25 | Discharge: 2024-09-25 | Disposition: A | Discharge status: Home / Self Care | Attending: Surgery | Admitting: Surgery

## 2024-09-25 ENCOUNTER — Inpatient Hospital Stay

## 2024-09-25 ENCOUNTER — Encounter (HOSPITAL_COMMUNITY): Payer: Self-pay | Admitting: Vascular Surgery

## 2024-09-25 ENCOUNTER — Ambulatory Visit
Admission: RE | Admit: 2024-09-25 | Discharge: 2024-09-25 | Disposition: A | Discharge status: Home / Self Care | Source: Ambulatory Visit | Attending: Surgery | Admitting: Surgery

## 2024-09-25 ENCOUNTER — Encounter (HOSPITAL_COMMUNITY): Admission: RE | Disposition: A | Payer: Self-pay | Source: Home / Self Care | Attending: Surgery

## 2024-09-25 ENCOUNTER — Encounter: Payer: Self-pay | Admitting: General Practice

## 2024-09-25 ENCOUNTER — Other Ambulatory Visit: Payer: Self-pay

## 2024-09-25 ENCOUNTER — Ambulatory Visit (HOSPITAL_COMMUNITY): Payer: Self-pay | Admitting: Anesthesiology

## 2024-09-25 ENCOUNTER — Inpatient Hospital Stay: Admitting: Hematology and Oncology

## 2024-09-25 DIAGNOSIS — Z803 Family history of malignant neoplasm of breast: Secondary | ICD-10-CM | POA: Diagnosis not present

## 2024-09-25 DIAGNOSIS — C50911 Malignant neoplasm of unspecified site of right female breast: Secondary | ICD-10-CM

## 2024-09-25 DIAGNOSIS — Z1722 Progesterone receptor negative status: Secondary | ICD-10-CM | POA: Diagnosis not present

## 2024-09-25 DIAGNOSIS — N6012 Diffuse cystic mastopathy of left breast: Secondary | ICD-10-CM | POA: Insufficient documentation

## 2024-09-25 DIAGNOSIS — C50511 Malignant neoplasm of lower-outer quadrant of right female breast: Secondary | ICD-10-CM | POA: Insufficient documentation

## 2024-09-25 DIAGNOSIS — Z1732 Human epidermal growth factor receptor 2 negative status: Secondary | ICD-10-CM | POA: Insufficient documentation

## 2024-09-25 DIAGNOSIS — E785 Hyperlipidemia, unspecified: Secondary | ICD-10-CM | POA: Insufficient documentation

## 2024-09-25 DIAGNOSIS — Z171 Estrogen receptor negative status [ER-]: Secondary | ICD-10-CM | POA: Insufficient documentation

## 2024-09-25 DIAGNOSIS — Z853 Personal history of malignant neoplasm of breast: Secondary | ICD-10-CM

## 2024-09-25 MED ORDER — EPHEDRINE SULFATE-NACL 50-0.9 MG/10ML-% IV SOSY
PREFILLED_SYRINGE | INTRAVENOUS | Status: DC | PRN
  Administered 2024-09-25: 10 mg via INTRAVENOUS

## 2024-09-25 MED ORDER — OXYCODONE HCL 5 MG/5ML PO SOLN: 5.0000 mg | Freq: Once | ORAL | Status: DC | PRN

## 2024-09-25 MED ORDER — CHLORHEXIDINE GLUCONATE CLOTH 2 % EX PADS: 6.0000 | MEDICATED_PAD | Freq: Once | CUTANEOUS | Status: DC

## 2024-09-25 MED ORDER — ONDANSETRON HCL 4 MG/2ML IJ SOLN
INTRAMUSCULAR | Status: DC | PRN
  Administered 2024-09-25: 4 mg via INTRAVENOUS

## 2024-09-25 MED ORDER — BUPIVACAINE-EPINEPHRINE (PF) 0.25% -1:200000 IJ SOLN
INTRAMUSCULAR | Status: AC
  Filled 2024-09-25: qty 30

## 2024-09-25 MED ORDER — ORAL CARE MOUTH RINSE: 15.0000 mL | Freq: Once | OROMUCOSAL | Status: AC

## 2024-09-25 MED ORDER — HEPARIN 6000 UNIT IRRIGATION SOLUTION
Status: AC
  Filled 2024-09-25: qty 500

## 2024-09-25 MED ORDER — BUPIVACAINE HCL (PF) 0.25 % IJ SOLN
INTRAMUSCULAR | Status: DC | PRN
  Administered 2024-09-25: 30 mL via EPIDURAL

## 2024-09-25 MED ORDER — LIDOCAINE HCL (PF) 1 % IJ SOLN
INTRAMUSCULAR | Status: AC
  Filled 2024-09-25: qty 30

## 2024-09-25 MED ORDER — ENSURE PRE-SURGERY PO LIQD: 296.0000 mL | Freq: Once | ORAL | Status: DC

## 2024-09-25 MED ORDER — HYDROMORPHONE HCL 1 MG/ML IJ SOLN
INTRAMUSCULAR | Status: DC | PRN
  Administered 2024-09-25: .5 mg via INTRAVENOUS

## 2024-09-25 MED ORDER — ROCURONIUM BROMIDE 10 MG/ML (PF) SYRINGE
PREFILLED_SYRINGE | INTRAVENOUS | Status: AC
  Filled 2024-09-25: qty 10

## 2024-09-25 MED ORDER — OXYCODONE HCL 5 MG PO TABS: 5.0000 mg | ORAL_TABLET | Freq: Once | ORAL | Status: DC | PRN

## 2024-09-25 MED ORDER — MIDAZOLAM HCL 2 MG/2ML IJ SOLN
INTRAMUSCULAR | Status: AC
  Administered 2024-09-25: 1 mg via INTRAVENOUS
  Filled 2024-09-25: qty 2

## 2024-09-25 MED ORDER — MIDAZOLAM HCL (PF) 2 MG/2ML IJ SOLN
INTRAMUSCULAR | Status: DC | PRN
  Administered 2024-09-25: 2 mg via INTRAVENOUS

## 2024-09-25 MED ORDER — PHENYLEPHRINE HCL-NACL 20-0.9 MG/250ML-% IV SOLN
INTRAVENOUS | Status: DC | PRN
  Administered 2024-09-25: 55 ug/min via INTRAVENOUS

## 2024-09-25 MED ORDER — FENTANYL CITRATE (PF) 100 MCG/2ML IJ SOLN
INTRAMUSCULAR | Status: AC
  Administered 2024-09-25: 50 ug via INTRAVENOUS
  Filled 2024-09-25: qty 2

## 2024-09-25 MED ORDER — CEFAZOLIN SODIUM-DEXTROSE 2-4 GM/100ML-% IV SOLN
2.0000 g | INTRAVENOUS | Status: AC
  Administered 2024-09-25: 2 g via INTRAVENOUS
  Filled 2024-09-25: qty 100

## 2024-09-25 MED ORDER — FENTANYL CITRATE (PF) 100 MCG/2ML IJ SOLN: 25.0000 ug | INTRAMUSCULAR | Status: DC | PRN

## 2024-09-25 MED ORDER — HEPARIN SOD (PORK) LOCK FLUSH 100 UNIT/ML IV SOLN
INTRAVENOUS | Status: AC
  Filled 2024-09-25: qty 5

## 2024-09-25 MED ORDER — DEXAMETHASONE SOD PHOSPHATE PF 10 MG/ML IJ SOLN
INTRAMUSCULAR | Status: DC | PRN
  Administered 2024-09-25: 5 mg via INTRAVENOUS

## 2024-09-25 MED ORDER — FENTANYL CITRATE (PF) 100 MCG/2ML IJ SOLN: 50.0000 ug | Freq: Once | INTRAMUSCULAR | Status: AC

## 2024-09-25 MED ORDER — TRAMADOL HCL 50 MG PO TABS: 50.0000 mg | ORAL_TABLET | Freq: Four times a day (QID) | ORAL | 0 refills | Status: AC | PRN

## 2024-09-25 MED ORDER — PROPOFOL 10 MG/ML IV BOLUS
INTRAVENOUS | Status: AC
  Filled 2024-09-25: qty 20

## 2024-09-25 MED ORDER — FENTANYL CITRATE (PF) 250 MCG/5ML IJ SOLN
INTRAMUSCULAR | Status: DC | PRN
  Administered 2024-09-25: 50 ug via INTRAVENOUS
  Administered 2024-09-25 (×2): 25 ug via INTRAVENOUS

## 2024-09-25 MED ORDER — PROPOFOL 10 MG/ML IV BOLUS
INTRAVENOUS | Status: DC | PRN
  Administered 2024-09-25: 50 mg via INTRAVENOUS
  Administered 2024-09-25: 150 mg via INTRAVENOUS

## 2024-09-25 MED ORDER — MIDAZOLAM HCL 2 MG/2ML IJ SOLN
INTRAMUSCULAR | Status: AC
  Filled 2024-09-25: qty 2

## 2024-09-25 MED ORDER — 0.9 % SODIUM CHLORIDE (POUR BTL) OPTIME
TOPICAL | Status: DC | PRN
  Administered 2024-09-25: 1000 mL

## 2024-09-25 MED ORDER — BUPIVACAINE HCL (PF) 0.25 % IJ SOLN
INTRAMUSCULAR | Status: AC
  Filled 2024-09-25: qty 30

## 2024-09-25 MED ORDER — HEPARIN 6000 UNIT IRRIGATION SOLUTION
Status: DC | PRN
  Administered 2024-09-25: 1

## 2024-09-25 MED ORDER — HYDROMORPHONE HCL 1 MG/ML IJ SOLN
INTRAMUSCULAR | Status: AC
  Filled 2024-09-25: qty 0.5

## 2024-09-25 MED ORDER — CHLORHEXIDINE GLUCONATE 0.12 % MT SOLN
15.0000 mL | Freq: Once | OROMUCOSAL | Status: AC
  Administered 2024-09-25: 15 mL via OROMUCOSAL
  Filled 2024-09-25: qty 15

## 2024-09-25 MED ORDER — HEPARIN SOD (PORK) LOCK FLUSH 100 UNIT/ML IV SOLN
INTRAVENOUS | Status: DC | PRN
  Administered 2024-09-25: 500 [IU]

## 2024-09-25 MED ORDER — LIDOCAINE 2% (20 MG/ML) 5 ML SYRINGE
INTRAMUSCULAR | Status: AC
  Filled 2024-09-25: qty 5

## 2024-09-25 MED ORDER — DEXAMETHASONE SOD PHOSPHATE PF 10 MG/ML IJ SOLN
INTRAMUSCULAR | Status: AC
  Filled 2024-09-25: qty 1

## 2024-09-25 MED ORDER — ACETAMINOPHEN 500 MG PO TABS
1000.0000 mg | ORAL_TABLET | ORAL | Status: AC
  Administered 2024-09-25: 1000 mg via ORAL
  Filled 2024-09-25: qty 2

## 2024-09-25 MED ORDER — LACTATED RINGERS IV SOLN: INTRAVENOUS | Status: DC

## 2024-09-25 MED ORDER — LIDOCAINE 2% (20 MG/ML) 5 ML SYRINGE
INTRAMUSCULAR | Status: DC | PRN
  Administered 2024-09-25: 100 mg via INTRAVENOUS

## 2024-09-25 MED ORDER — AMISULPRIDE (ANTIEMETIC) 5 MG/2ML IV SOLN: 10.0000 mg | Freq: Once | INTRAVENOUS | Status: DC | PRN

## 2024-09-25 MED ORDER — ONDANSETRON HCL 4 MG/2ML IJ SOLN
INTRAMUSCULAR | Status: AC
  Filled 2024-09-25: qty 2

## 2024-09-25 MED ORDER — MIDAZOLAM HCL (PF) 2 MG/2ML IJ SOLN: 1.0000 mg | Freq: Once | INTRAMUSCULAR | Status: AC

## 2024-09-25 MED ORDER — BUPIVACAINE-EPINEPHRINE 0.25% -1:200000 IJ SOLN
INTRAMUSCULAR | Status: DC | PRN
  Administered 2024-09-25: 22 mL

## 2024-09-25 MED ORDER — MAGTRACE LYMPHATIC TRACER
INTRAMUSCULAR | Status: DC | PRN
  Administered 2024-09-25: 2 mL via INTRAMUSCULAR

## 2024-09-25 MED ORDER — PHENYLEPHRINE 80 MCG/ML (10ML) SYRINGE FOR IV PUSH (FOR BLOOD PRESSURE SUPPORT)
PREFILLED_SYRINGE | INTRAVENOUS | Status: DC | PRN
  Administered 2024-09-25: 160 ug via INTRAVENOUS
  Administered 2024-09-25 (×2): 120 ug via INTRAVENOUS

## 2024-09-25 MED ORDER — FENTANYL CITRATE (PF) 100 MCG/2ML IJ SOLN
INTRAMUSCULAR | Status: AC
  Filled 2024-09-25: qty 2

## 2024-09-25 NOTE — Op Note (Signed)
 "  Liany Mumpower 09/25/2024   Pre-op Diagnosis: RIGHT BREAST CANCER     Post-op Diagnosis: SAME  Procedures: RADIOACTIVE SEED GUIDED RIGHT BREAST LUMPECTOMY DEEP RIGHT AXILLARY SENTINEL LYMPH NODE BIOPSY LEFT SUBCLAVIAN VEIN PORT A CATH INSERTION (8 FR) INJECTION OF MAG TRACE FOR LYMPH NODE MAPPING   Surgeon(s): Vernetta Berg, MD  Anesthesia: General  Staff:  Circulator: Bari Birmingham, RN Radiology Technologist: Beauty Beau, RT Relief Scrub: Myrna Beulah BROCKS, RN Scrub Person: Tomas Krabbe, RN; Perez-Vasquez, Tiffany  Estimated Blood Loss: Minimal               Specimens: SENT TO PATH  Indications: This is a 54 year old female was found on screen mammography to have a almost 3 cm mass in the lower outer quadrant of the right breast.  A biopsy of the mass showed an invasive ductal carcinoma which was triple negative.  After discussion with oncology decision was made to proceed with a radioactive seed guided right breast lumpectomy, sentinel lymph node biopsy, and Port-A-Cath insertion  Findings: The Port-A-Cath was inserted in the left subclavian vein and appeared to be in the proximal superior vena cava  Procedure: The patient was brought to the operating identified the correct patient.  She was placed upon on the operating table and general anesthesia was induced.  Next under sterile technique I injected mag trace underneath the right nipple areolar complex and massaged the breast.  Her left chest and neck were then prepped and draped in usual sterile fashion.  The patient was placed in a Trendelenburg position.  I anesthetized the left chest and clavicle with Marcaine .  I then used the introducer needle to easily cannulate the left subclavian vein.  I passed a wire through the needle and into the central venous system which was confirmed with fluoroscopy.  I removed the needle.  I then anesthetized the wire site further with Marcaine  and made a longitudinal  incision with a scalpel.  I then dissected down into the subcutaneous tissue with the cautery.  I created a pocket for the port.  An 8 French port and catheter been brought to the field and flushed appropriately.  I next passed the introducer sheath over the guidewire and easily into the central venous system.  The guidewire and dilator were then removed.  I attached the catheter to the port.  I placed the port into the pocket and cut the cath in appropriate length.  I then fed it down the peel-away sheath into the central venous system.  The sheath was peeled away leaving the catheter and central venous system.  On fluoroscopy it appeared to be in the superior vena cava.  I then accessed the port and good flush and return redemonstrated and I instilled concentrated heparin  solution into the port.  I sutured the port in place with a single 2-0 Prolene suture.  I then closed the subcutaneous tissue with interrupted 3-0 Vicryl sutures and closed the skin with a running 4 Monocryl.  Dermabond is then applied.  All drapes were removed and the patient was reprepped and draped.  This time we have prepped the right breast and axilla.  Using the neoprobe I identified the signal from the radioactive seed in the lower outer quadrant of the right breast 6 mm from the nipple.  I anesthetized this area with Marcaine  and the made elliptical incision with a scalpel.  I then dissected down to the breast tissue with electrocautery.  With the aid of the neoprobe I  then attempted to stay widely around the signal from the radioactive seed.  I could feel the mass in the breast specimen.  I dissected superior and then lateral medial to the mass and then inferiorly and then finally posteriorly after elevating the lumpectomy specimen out of the wound.  Once the lumpectomy specimen was removed I marked all margins with paint.  An x-ray was performed on the specimen confirming the radioactive seed and previous biopsy clip were in the  specimen.  The specimen was sent to pathology for evaluation.  I next identified an area of increased uptake in the right axilla using the mag trace probe.  I anesthetized skin in the axilla with Marcaine  and made incision with a scalpel.  I then dissected down to the deep axillary tissue with electrocautery.  During this the plastic wheat Stark broke at the hinge.  This piece never went into the patient and was placed on the back table and a new wheat Stark was used.  I then dissected further into the deep axilla and with the aid of the mag trace probe identified several sentinel lymph nodes.  The fat was very friable so I excised the surrounding fat with the lymph nodes with electrocautery.  Several lymphatics and vein were clipped with surgical clips.  I excised the lymph nodes on blocks and 2 separate specimens and sent these together the pathology as the sentinel lymph nodes.  Hemostasis was achieved in the axilla.  I injected the lumpectomy cavity further with Marcaine  in place surgical clips around the periphery of the lumpectomy cavity.  I then closed both incisions with interrupted 3-0 Vicryl sutures and 4-0 Monocryl sutures.  Dermabond was then applied.  The patient tolerated the procedure well.  All the counts were correct at the end of the procedure.  The patient was then extubated in the operating room and taken in a stable condition to the recovery room.          Sherri Walton   Date: 09/25/2024  Time: 4:33 PM    "

## 2024-09-25 NOTE — Anesthesia Procedure Notes (Signed)
 Procedure Name: LMA Insertion Date/Time: 09/25/2024 2:46 PM  Performed by: Mannie Krystal LABOR, CRNAPre-anesthesia Checklist: Patient identified, Emergency Drugs available, Suction available and Patient being monitored Patient Re-evaluated:Patient Re-evaluated prior to induction Oxygen Delivery Method: Circle system utilized Preoxygenation: Pre-oxygenation with 100% oxygen Induction Type: IV induction LMA: LMA inserted LMA Size: 4.0 Number of attempts: 1 Placement Confirmation: positive ETCO2 and breath sounds checked- equal and bilateral Tube secured with: Tape Dental Injury: Teeth and Oropharynx as per pre-operative assessment

## 2024-09-25 NOTE — Progress Notes (Signed)
 [facility] Spiritual Care Note  Attempted follow-up by phone for blessing prior to surgery as requested, but left voicemail of blessing, support, encouragement.  [address] Olam Corrigan, [place], [facility] Pager [phone] Voicemail [phone]

## 2024-09-25 NOTE — Discharge Instructions (Signed)
 [facility] Office Phone Number [phone]  BREAST BIOPSY/ PARTIAL MASTECTOMY: POST OP INSTRUCTIONS  Always review your discharge instruction sheet given to you by the facility where your surgery was performed.  IF YOU HAVE DISABILITY OR FAMILY LEAVE FORMS, YOU MUST BRING THEM TO THE OFFICE FOR PROCESSING.  DO NOT GIVE THEM TO YOUR DOCTOR.  A prescription for pain medication may be given to you upon discharge.  Take your pain medication as prescribed, if needed.  If narcotic pain medicine is not needed, then you may take acetaminophen  (Tylenol ) or ibuprofen  (Advil ) as needed. Take your usually prescribed medications unless otherwise directed If you need a refill on your pain medication, please contact your pharmacy.  They will contact our office to request authorization.  Prescriptions will not be filled after 5pm or on week-ends. You should eat very light the first 24 hours after surgery, such as soup, crackers, pudding, etc.  Resume your normal diet the day after surgery. Most patients will experience some swelling and bruising in the breast.  Ice packs and a good support bra will help.  Swelling and bruising can take several days to resolve.  It is common to experience some constipation if taking pain medication after surgery.  Increasing fluid intake and taking a stool softener will usually help or prevent this problem from occurring.  A mild laxative (Milk of Magnesia or Miralax) should be taken according to package directions if there are no bowel movements after 48 hours. Unless discharge instructions indicate otherwise, you may remove your bandages 24-48 hours after surgery, and you may shower at that time.  You may have steri-strips (small skin tapes) in place directly over the incision.  These strips should be left on the skin for 7-10 days.  If your surgeon used skin glue on the incision, you may shower in 24 hours.  The glue will flake off over the next 2-3 weeks.  Any  sutures or staples will be removed at the office during your follow-up visit. ACTIVITIES:  You may resume regular daily activities (gradually increasing) beginning the next day.  Wearing a good support bra or sports bra minimizes pain and swelling.  You may have sexual intercourse when it is comfortable. You may drive when you no longer are taking prescription pain medication, you can comfortably wear a seatbelt, and you can safely maneuver your car and apply brakes. RETURN TO WORK:  ______________________________________________________________________________________ Rosine should see your doctor in the office for a follow-up appointment approximately two weeks after your surgery.  Your doctors nurse will typically make your follow-up appointment when she calls you with your pathology report.  Expect your pathology report 2-3 business days after your surgery.  You may call to check if you do not hear from us  after three days. OTHER INSTRUCTIONS:YOU MAY REMOVE THE BINDER AND SHOWER TOMORROW AND THEN PUT THE BINDER BACK ON ICE PACK, TYLENOL , AND IBUPROFEN  ALSO FOR PAIN NO VIGOROUS ACTIVITY FOR ONE WEEK _______________________________________________________________________________________________ _____________________________________________________________________________________________________________________________________ _____________________________________________________________________________________________________________________________________ _____________________________________________________________________________________________________________________________________  WHEN TO CALL YOUR DOCTOR: Fever over 101.0 Nausea and/or vomiting. Extreme swelling or bruising. Continued bleeding from incision. Increased pain, redness, or drainage from the incision.  The clinic staff is available to answer your questions during regular business hours.  Please dont hesitate to call and ask  to speak to one of the nurses for clinical concerns.  If you have a medical emergency, go to the nearest emergency room or call 911.  A surgeon from [facility] is always on call  at the hospital.  For further questions, please visit [email or web address]

## 2024-09-25 NOTE — Interval H&P Note (Signed)
 History and Physical Interval Note:no change in H and p  09/25/2024 2:29 PM  Sherri Walton  has presented today for surgery, with the diagnosis of RIGHT BREAST CANCER.  The various methods of treatment have been discussed with the patient and family. After consideration of risks, benefits and other options for treatment, the patient has consented to  Procedures with comments: BREAST LUMPECTOMY WITH RADIOACTIVE SEED AND SENTINEL LYMPH NODE BIOPSY (Right) - RADIOACTIVE SEED GUIDED RIGHT BREAST LUMPECTOMY WITH SENTINEL NODE BIOPSY INSERTION, TUNNELED CENTRAL VENOUS DEVICE, WITH PORT (N/A) - PORT PLACEMENT WITH ULTRASOUND GUIDANCE as a surgical intervention.  The patient's history has been reviewed, patient examined, no change in status, stable for surgery.  I have reviewed the patient's chart and labs.  Questions were answered to the patient's satisfaction.     Vicenta Poli

## 2024-09-25 NOTE — Transfer of Care (Signed)
 Immediate Anesthesia Transfer of Care Note  Patient: Sherri Walton  Procedure(s) Performed: BREAST LUMPECTOMY WITH RADIOACTIVE SEED AND SENTINEL LYMPH NODE BIOPSY (Right: Breast) INSERTION, TUNNELED CENTRAL VENOUS DEVICE, WITH PORT AND ULTRASOUND GUIDANCE (Left: Chest)  Patient Location: PACU  Anesthesia Type:General  Level of Consciousness: awake, alert , and oriented  Airway & Oxygen Therapy: Patient Spontanous Breathing and Patient connected to face mask oxygen  Post-op Assessment: Report given to RN and Post -op Vital signs reviewed and stable  Post vital signs: Reviewed and stable  Last Vitals:  Vitals Value Taken Time  BP 164/95 09/25/24 16:39  Temp    Pulse 88 09/25/24 16:44  Resp 17 09/25/24 16:44  SpO2 92 % 09/25/24 16:44  Vitals shown include unfiled device data.  Last Pain:  Vitals:   09/25/24 1355  TempSrc:   PainSc: 0-No pain      Patients Stated Pain Goal: 0 (09/25/24 1303)  Complications: No notable events documented.

## 2024-09-25 NOTE — Anesthesia Procedure Notes (Signed)
 Anesthesia Regional Block: Pectoralis block   Pre-Anesthetic Checklist: , timeout performed,  Correct Patient, Correct Site, Correct Laterality,  Correct Procedure, Correct Position, site marked,  Risks and benefits discussed,  Surgical consent,  Pre-op evaluation,  At surgeon's request and post-op pain management  Laterality: Right  Prep: chloraprep       Needles:  Injection technique: Single-shot  Needle Type: Echogenic Stimulator Needle     Needle Length: 9cm  Needle Gauge: 21     Additional Needles:   Procedures:,,,, ultrasound used (permanent image in chart),,    Narrative:  Start time: 09/25/2024 1:47 PM End time: 09/25/2024 1:52 PM Injection made incrementally with aspirations every 5 mL.  Performed by: Personally  Anesthesiologist: Peggye Delon Brunswick, MD  Additional Notes: Discussed risks and benefits of nerve block including, but not limited to, prolonged and/or permanent nerve injury involving sensory and/or motor function. Monitors were applied and a time-out was performed. The nerve and associated structures were visualized under ultrasound guidance. After negative aspiration, local anesthetic was slowly injected around the nerve. There was no evidence of high pressure during the procedure. There were no paresthesias. VSS remained stable and the patient tolerated the procedure well.

## 2024-09-26 ENCOUNTER — Encounter (HOSPITAL_COMMUNITY): Payer: Self-pay | Admitting: Surgery

## 2024-09-29 LAB — SURGICAL PATHOLOGY

## 2024-09-29 NOTE — Anesthesia Postprocedure Evaluation (Signed)
"   Anesthesia Post Note  Patient: Sherri Walton  Procedure(s) Performed: BREAST LUMPECTOMY WITH RADIOACTIVE SEED AND SENTINEL LYMPH NODE BIOPSY (Right: Breast) INSERTION, TUNNELED CENTRAL VENOUS DEVICE, WITH PORT AND ULTRASOUND GUIDANCE (Left: Chest)     Patient location during evaluation: PACU Anesthesia Type: General Level of consciousness: awake Pain management: pain level controlled Vital Signs Assessment: post-procedure vital signs reviewed and stable Respiratory status: spontaneous breathing Cardiovascular status: blood pressure returned to baseline Postop Assessment: no apparent nausea or vomiting Anesthetic complications: no   No notable events documented.  Last Vitals:  Vitals:   09/25/24 1715 09/25/24 1727  BP: (!) 133/90 (!) 129/95  Pulse: 79 84  Resp: 18 19  Temp:    SpO2: 92% 93%    Last Pain:  Vitals:   09/25/24 1727  TempSrc:   PainSc: 0-No pain                 Alejah Aristizabal T Colhoun      "

## 2024-09-30 ENCOUNTER — Encounter: Payer: Self-pay | Admitting: *Deleted

## 2024-09-30 NOTE — Progress Notes (Signed)
 Patient called navigator to check with her about her future appts. Informed her about Dr. Loretha in two weeks and Dr. Vernetta in three weeks. Had some general questions about chemo and she is aware she will have a chemo class. Her cousin Holley will be attending future appts with her. No further questions for navigator at this time.

## 2024-10-14 ENCOUNTER — Inpatient Hospital Stay: Attending: Nurse Practitioner | Admitting: Hematology and Oncology
